# Patient Record
Sex: Female | Born: 1945 | Race: Black or African American | Hispanic: No | Smoking: Never smoker
Health system: Southern US, Community
[De-identification: ages and names within clinical notes are randomized; demographics above are authoritative.]

## PROBLEM LIST (undated history)

## (undated) DIAGNOSIS — R3129 Other microscopic hematuria: Secondary | ICD-10-CM

## (undated) HISTORY — PX: BUNIONECTOMY: SHX129

## (undated) HISTORY — DX: Other microscopic hematuria: R31.29

---

## 2000-01-20 ENCOUNTER — Other Ambulatory Visit: Admission: RE | Admit: 2000-01-20 | Discharge: 2000-01-20 | Payer: Self-pay | Admitting: Obstetrics and Gynecology

## 2001-01-31 ENCOUNTER — Other Ambulatory Visit: Admission: RE | Admit: 2001-01-31 | Discharge: 2001-01-31 | Payer: Self-pay | Admitting: Obstetrics and Gynecology

## 2002-01-31 ENCOUNTER — Other Ambulatory Visit: Admission: RE | Admit: 2002-01-31 | Discharge: 2002-01-31 | Payer: Self-pay | Admitting: Obstetrics and Gynecology

## 2003-02-26 ENCOUNTER — Other Ambulatory Visit: Admission: RE | Admit: 2003-02-26 | Discharge: 2003-02-26 | Payer: Self-pay | Admitting: Obstetrics and Gynecology

## 2004-03-23 ENCOUNTER — Other Ambulatory Visit: Admission: RE | Admit: 2004-03-23 | Discharge: 2004-03-23 | Payer: Self-pay | Admitting: Obstetrics and Gynecology

## 2005-04-06 ENCOUNTER — Other Ambulatory Visit: Admission: RE | Admit: 2005-04-06 | Discharge: 2005-04-06 | Payer: Self-pay | Admitting: Obstetrics and Gynecology

## 2006-04-28 ENCOUNTER — Other Ambulatory Visit: Admission: RE | Admit: 2006-04-28 | Discharge: 2006-04-28 | Payer: Self-pay | Admitting: Obstetrics and Gynecology

## 2007-08-02 ENCOUNTER — Other Ambulatory Visit: Admission: RE | Admit: 2007-08-02 | Discharge: 2007-08-02 | Payer: Self-pay | Admitting: Obstetrics and Gynecology

## 2007-10-26 ENCOUNTER — Emergency Department (HOSPITAL_COMMUNITY): Admission: EM | Admit: 2007-10-26 | Discharge: 2007-10-26 | Payer: Self-pay | Admitting: Family Medicine

## 2008-08-05 ENCOUNTER — Other Ambulatory Visit: Admission: RE | Admit: 2008-08-05 | Discharge: 2008-08-05 | Payer: Self-pay | Admitting: Obstetrics and Gynecology

## 2008-08-05 ENCOUNTER — Ambulatory Visit: Payer: Self-pay | Admitting: Obstetrics and Gynecology

## 2008-09-04 ENCOUNTER — Ambulatory Visit: Payer: Self-pay | Admitting: Obstetrics and Gynecology

## 2008-11-12 ENCOUNTER — Telehealth: Payer: Self-pay | Admitting: Internal Medicine

## 2009-08-06 ENCOUNTER — Ambulatory Visit: Payer: Self-pay | Admitting: Obstetrics and Gynecology

## 2009-08-06 ENCOUNTER — Other Ambulatory Visit: Admission: RE | Admit: 2009-08-06 | Discharge: 2009-08-06 | Payer: Self-pay | Admitting: Obstetrics and Gynecology

## 2010-08-23 ENCOUNTER — Ambulatory Visit: Admit: 2010-08-23 | Payer: Self-pay | Admitting: Obstetrics and Gynecology

## 2010-09-09 ENCOUNTER — Ambulatory Visit
Admission: RE | Admit: 2010-09-09 | Discharge: 2010-09-09 | Payer: Self-pay | Source: Home / Self Care | Attending: Obstetrics and Gynecology | Admitting: Obstetrics and Gynecology

## 2010-09-09 ENCOUNTER — Other Ambulatory Visit: Payer: Self-pay | Admitting: Obstetrics and Gynecology

## 2010-09-09 ENCOUNTER — Other Ambulatory Visit (HOSPITAL_COMMUNITY)
Admission: RE | Admit: 2010-09-09 | Discharge: 2010-09-09 | Disposition: A | Discharge status: Home / Self Care | Payer: 59 | Source: Ambulatory Visit | Attending: Obstetrics and Gynecology | Admitting: Obstetrics and Gynecology

## 2010-09-09 DIAGNOSIS — Z124 Encounter for screening for malignant neoplasm of cervix: Secondary | ICD-10-CM | POA: Insufficient documentation

## 2010-10-03 ENCOUNTER — Emergency Department (HOSPITAL_COMMUNITY)
Admission: EM | Admit: 2010-10-03 | Discharge: 2010-10-04 | Disposition: A | Discharge status: Home / Self Care | Payer: 59 | Attending: Emergency Medicine | Admitting: Emergency Medicine

## 2010-10-03 DIAGNOSIS — R05 Cough: Secondary | ICD-10-CM | POA: Insufficient documentation

## 2010-10-03 DIAGNOSIS — R059 Cough, unspecified: Secondary | ICD-10-CM | POA: Insufficient documentation

## 2010-10-03 DIAGNOSIS — R509 Fever, unspecified: Secondary | ICD-10-CM | POA: Insufficient documentation

## 2010-10-03 DIAGNOSIS — J40 Bronchitis, not specified as acute or chronic: Secondary | ICD-10-CM | POA: Insufficient documentation

## 2010-10-03 DIAGNOSIS — R51 Headache: Secondary | ICD-10-CM | POA: Insufficient documentation

## 2010-10-04 ENCOUNTER — Emergency Department (HOSPITAL_COMMUNITY): Payer: 59

## 2011-09-07 DIAGNOSIS — R3129 Other microscopic hematuria: Secondary | ICD-10-CM | POA: Insufficient documentation

## 2011-09-30 ENCOUNTER — Encounter: Payer: Self-pay | Admitting: Obstetrics and Gynecology

## 2011-09-30 ENCOUNTER — Other Ambulatory Visit (HOSPITAL_COMMUNITY)
Admission: RE | Admit: 2011-09-30 | Discharge: 2011-09-30 | Disposition: A | Discharge status: Home / Self Care | Payer: 59 | Source: Ambulatory Visit | Attending: Obstetrics and Gynecology | Admitting: Obstetrics and Gynecology

## 2011-09-30 ENCOUNTER — Ambulatory Visit (INDEPENDENT_AMBULATORY_CARE_PROVIDER_SITE_OTHER): Payer: 59 | Admitting: Obstetrics and Gynecology

## 2011-09-30 ENCOUNTER — Other Ambulatory Visit: Payer: Self-pay | Admitting: Obstetrics and Gynecology

## 2011-09-30 VITALS — BP 120/74 | Ht 63.0 in | Wt 162.0 lb

## 2011-09-30 DIAGNOSIS — Z01419 Encounter for gynecological examination (general) (routine) without abnormal findings: Secondary | ICD-10-CM | POA: Insufficient documentation

## 2011-09-30 LAB — LIPID PANEL
Cholesterol: 162 mg/dL (ref 0–200)
Total CHOL/HDL Ratio: 2.5 Ratio

## 2011-09-30 LAB — CBC WITH DIFFERENTIAL/PLATELET
Eosinophils Absolute: 0 10*3/uL (ref 0.0–0.7)
Eosinophils Relative: 1 % (ref 0–5)
HCT: 36.8 % (ref 36.0–46.0)
Hemoglobin: 12 g/dL (ref 12.0–15.0)
Lymphs Abs: 2.1 10*3/uL (ref 0.7–4.0)
MCH: 30.6 pg (ref 26.0–34.0)
MCV: 93.9 fL (ref 78.0–100.0)
Monocytes Absolute: 0.1 10*3/uL (ref 0.1–1.0)
Monocytes Relative: 2 % — ABNORMAL LOW (ref 3–12)
RBC: 3.92 MIL/uL (ref 3.87–5.11)

## 2011-09-30 LAB — URINALYSIS W MICROSCOPIC + REFLEX CULTURE
Crystals: NONE SEEN
Glucose, UA: NEGATIVE mg/dL
Protein, ur: NEGATIVE mg/dL
Specific Gravity, Urine: 1.025 (ref 1.005–1.030)
Urobilinogen, UA: 0.2 mg/dL (ref 0.0–1.0)

## 2011-09-30 NOTE — Progress Notes (Signed)
Patient came to see me today for her annual GYN exam. She is having no menopausal symptoms. She is having no vaginal bleeding. She is having no pelvic pain. She had a normal bone density. She is overdue for mammogram.  Physical examination: Kennon Portela present. HEENT within normal limits. Neck: Thyroid not large. No masses. Supraclavicular nodes: not enlarged. Breasts: Examined in both sitting midline position. No skin changes and no masses. Abdomen: Soft no guarding rebound or masses or hernia. Pelvic: External: Within normal limits. BUS: Within normal limits. Vaginal:within normal limits. Good estrogen effect. No evidence of cystocele rectocele or enterocele. Cervix: clean. Uterus: Normal size and shape. Adnexa: No masses. Rectovaginal exam: Confirmatory and negative. Extremities: Within normal limits.  Assessment: Normal GYN exam  Plan: Mammogram

## 2011-10-02 LAB — URINE CULTURE: Colony Count: 8000

## 2012-11-13 ENCOUNTER — Ambulatory Visit (INDEPENDENT_AMBULATORY_CARE_PROVIDER_SITE_OTHER): Payer: 59 | Admitting: Gynecology

## 2012-11-13 ENCOUNTER — Telehealth: Payer: Self-pay | Admitting: *Deleted

## 2012-11-13 ENCOUNTER — Encounter: Payer: Self-pay | Admitting: Gynecology

## 2012-11-13 ENCOUNTER — Telehealth: Payer: Self-pay | Admitting: Internal Medicine

## 2012-11-13 VITALS — BP 120/76 | Ht 64.0 in | Wt 156.0 lb

## 2012-11-13 DIAGNOSIS — N6310 Unspecified lump in the right breast, unspecified quadrant: Secondary | ICD-10-CM

## 2012-11-13 DIAGNOSIS — Z01419 Encounter for gynecological examination (general) (routine) without abnormal findings: Secondary | ICD-10-CM

## 2012-11-13 DIAGNOSIS — N63 Unspecified lump in unspecified breast: Secondary | ICD-10-CM

## 2012-11-13 DIAGNOSIS — M899 Disorder of bone, unspecified: Secondary | ICD-10-CM

## 2012-11-13 DIAGNOSIS — M8580 Other specified disorders of bone density and structure, unspecified site: Secondary | ICD-10-CM

## 2012-11-13 DIAGNOSIS — N952 Postmenopausal atrophic vaginitis: Secondary | ICD-10-CM

## 2012-11-13 DIAGNOSIS — Z13220 Encounter for screening for lipoid disorders: Secondary | ICD-10-CM

## 2012-11-13 LAB — COMPREHENSIVE METABOLIC PANEL
Albumin: 3.9 g/dL (ref 3.5–5.2)
Alkaline Phosphatase: 66 U/L (ref 39–117)
BUN: 12 mg/dL (ref 6–23)
Calcium: 9.6 mg/dL (ref 8.4–10.5)
Chloride: 107 mEq/L (ref 96–112)
Glucose, Bld: 83 mg/dL (ref 70–99)
Potassium: 4.1 mEq/L (ref 3.5–5.3)
Sodium: 140 mEq/L (ref 135–145)
Total Protein: 6.6 g/dL (ref 6.0–8.3)

## 2012-11-13 LAB — CBC WITH DIFFERENTIAL/PLATELET
HCT: 38.3 % (ref 36.0–46.0)
Hemoglobin: 13.1 g/dL (ref 12.0–15.0)
Lymphocytes Relative: 57 % — ABNORMAL HIGH (ref 12–46)
Lymphs Abs: 1.9 10*3/uL (ref 0.7–4.0)
MCHC: 34.2 g/dL (ref 30.0–36.0)
Monocytes Absolute: 0.2 10*3/uL (ref 0.1–1.0)
Monocytes Relative: 7 % (ref 3–12)
Neutro Abs: 1.2 10*3/uL — ABNORMAL LOW (ref 1.7–7.7)
Neutrophils Relative %: 33 % — ABNORMAL LOW (ref 43–77)
RBC: 4.42 MIL/uL (ref 3.87–5.11)
WBC: 3.4 10*3/uL — ABNORMAL LOW (ref 4.0–10.5)

## 2012-11-13 LAB — LIPID PANEL
HDL: 67 mg/dL (ref >39)
LDL Cholesterol: 100 mg/dL — ABNORMAL HIGH (ref 0–99)
Triglycerides: 72 mg/dL (ref <150)

## 2012-11-13 LAB — TSH: TSH: 1.415 u[IU]/mL (ref 0.350–4.500)

## 2012-11-13 NOTE — Telephone Encounter (Signed)
Order placed

## 2012-11-13 NOTE — Telephone Encounter (Signed)
Ok new pt Thx 

## 2012-11-13 NOTE — Progress Notes (Signed)
Sarah Sparks [birth year] [id number]        67 y.o.  G2P2001 for annual exam.  Former patient of Dr. Eda Paschal without complaints.  Past medical history,surgical history, medications, allergies, family history and social history were all reviewed and documented in the EPIC chart. ROS:  Was performed and pertinent positives and negatives are included in the history.  Exam: Kim assistant Filed Vitals:   11/13/12 1058  BP: 120/76  Height: 5\' 4"  (1.626 m)  Weight: 156 lb (70.761 kg)   General appearance  Normal Skin grossly normal Head/Neck normal with no cervical or supraclavicular adenopathy thyroid normal Lungs  clear Cardiac RR, without RMG Abdominal  soft, nontender, without masses, organomegaly or hernia Breasts  examined lying and sitting. Left without masses, retractions, discharge or axillary adenopathy. Right with palpable ill-defined mass approximately 1.5 cm tail of Spence. No overlying skin changes nipple discharge or axillary adenopathy. Pelvic  Ext/BUS/vagina  normal with mild atrophic changes  Cervix  normal with atrophic changes  Uterus  axial, normal size, shape and contour, midline and mobile nontender   Adnexa  Without masses or tenderness    Anus and perineum  normal   Rectovaginal  normal sphincter tone without palpated masses or tenderness.    Assessment/Plan:  67 y.o. G76P2001 female for annual exam.   1. Right breast mass, suspicious. Last mammogram a number of years ago. Reviewed findings with the patient and the need for followup to include diagnostic mammography and ultrasound. Possibilities for breast cancer reviewed. Patient understands the importance of followup and we will contact her to schedule these appointments. She knows if we do not call her within several days to call us in followup. 2. Postmenopausal. No bleeding or significant symptoms such as hot flushes, night sweats or vaginal dryness. We'll continue to follow. 3. Osteopenia. DEXA 2009 showed T  score -1.5. Recommend repeat DEXA now. Increase calcium vitamin D reviewed. 4. Colonoscopy never. I emphasized the risk of colon cancer with advancing age and the benefits of polyp or early cancer detection. I strongly recommended her to schedule a screening colonoscopy with [facility] as she works right across the street. Patient agrees to call and arrange. 5. Pap smear 2013. No Pap smear done today. No history of abnormal Pap smears. Discussed current screening guidelines and options to stop screening as she is 67 or less frequent screening options reviewed. We'll readdress on an annual basis. 6. Health maintenance. Patient does not have a primary physician. I recommended that she schedule an appointment with [facility] to establish care and be followed by them. She is 67 and I think that she needs to start being followed by an internist. I did order baseline labs to include CBC comprehensive metabolic panel TSH lipid profile vitamin D and urinalysis.   Dara Lords MD, 12:07 PM 11/13/2012

## 2012-11-13 NOTE — Telephone Encounter (Signed)
Pt called asking to see if Dr. Posey Rea will take her as a new pt, I inform pt that the doctor is not taking new pt at this time. Pt stated that her parents used to come to Dr. Wonda Horner Allyson Sabal and Marguerita Merles), that's why she wants to see Dr. Macario Golds as a PCP. Please advise.

## 2012-11-13 NOTE — Telephone Encounter (Signed)
Message copied by Aura Camps on Tue Nov 13, 2012  2:40 PM ------      Message from: Dara Lords      Created: Tue Nov 13, 2012 12:13 PM       Schedule diagnostic mammography and ultrasound at breast center. Reference right tail of Spence breast mass suspicious for cancer. ------

## 2012-11-13 NOTE — Patient Instructions (Signed)
Office will call you to arrange mammography and ultrasound to assess the right breast mass. It is very important that we rule out breast cancer and that you followup for these exams. Call [facility] and arrange for a screening colonoscopy. Early detection of colon cancer is very important and could save your life. Followup in one year for annual exam.

## 2012-11-14 ENCOUNTER — Encounter: Payer: Self-pay | Admitting: Obstetrics and Gynecology

## 2012-11-14 ENCOUNTER — Other Ambulatory Visit: Payer: Self-pay | Admitting: *Deleted

## 2012-11-14 DIAGNOSIS — N6310 Unspecified lump in the right breast, unspecified quadrant: Secondary | ICD-10-CM

## 2012-11-14 LAB — URINALYSIS W MICROSCOPIC + REFLEX CULTURE
Bilirubin Urine: NEGATIVE
Casts: NONE SEEN
Crystals: NONE SEEN
Ketones, ur: NEGATIVE mg/dL
Nitrite: NEGATIVE
Specific Gravity, Urine: 1.014 (ref 1.005–1.030)
Urobilinogen, UA: 0.2 mg/dL (ref 0.0–1.0)
pH: 6 (ref 5.0–8.0)

## 2012-11-14 NOTE — Telephone Encounter (Signed)
Called pt, left VM to call back. Will try again if pt have not call back.

## 2012-11-16 ENCOUNTER — Other Ambulatory Visit: Payer: Self-pay | Admitting: Gynecology

## 2012-11-16 DIAGNOSIS — E559 Vitamin D deficiency, unspecified: Secondary | ICD-10-CM

## 2012-11-16 DIAGNOSIS — R8271 Bacteriuria: Secondary | ICD-10-CM

## 2012-11-16 LAB — URINE CULTURE: Colony Count: 45000

## 2012-11-16 MED ORDER — ERGOCALCIFEROL 1.25 MG (50000 UT) PO CAPS: 50000.0000 [IU] | ORAL_CAPSULE | ORAL | Status: DC

## 2012-11-22 ENCOUNTER — Other Ambulatory Visit: Payer: 59

## 2012-11-27 ENCOUNTER — Encounter: Payer: Self-pay | Admitting: Gynecology

## 2012-11-27 NOTE — Telephone Encounter (Signed)
I tried to call pt to ask her to call breast center back to schedule testing, they have left 2 messages for pt to call and schedule. I called home # and it is no longer working, I called work # and pt was off today. Will try back later.

## 2012-12-17 NOTE — Telephone Encounter (Signed)
I called patient regarding the below staff message TF sent to me. I left message on home # for pt to call.   " I was following up on this patient and she was to have a diagnostic mammogram and ultrasound in reference to mass in the right breast. It looks like she had a screening mammogram which is not the right test. Recommend office visit so I can reexamine her and then decide if we need to pursue a diagnostic look at that site. Let me know that the patient has scheduled the appointment."

## 2012-12-20 ENCOUNTER — Other Ambulatory Visit: Payer: 59

## 2012-12-20 ENCOUNTER — Ambulatory Visit: Payer: 59

## 2012-12-20 DIAGNOSIS — R8271 Bacteriuria: Secondary | ICD-10-CM

## 2012-12-21 ENCOUNTER — Other Ambulatory Visit: Payer: 59

## 2012-12-21 ENCOUNTER — Ambulatory Visit (INDEPENDENT_AMBULATORY_CARE_PROVIDER_SITE_OTHER): Payer: 59 | Admitting: Internal Medicine

## 2012-12-21 ENCOUNTER — Encounter: Payer: Self-pay | Admitting: Internal Medicine

## 2012-12-21 VITALS — BP 118/80 | HR 84 | Temp 98.5°F | Resp 16 | Ht 63.0 in | Wt 157.0 lb

## 2012-12-21 DIAGNOSIS — Z0000 Encounter for general adult medical examination without abnormal findings: Secondary | ICD-10-CM | POA: Insufficient documentation

## 2012-12-21 DIAGNOSIS — E559 Vitamin D deficiency, unspecified: Secondary | ICD-10-CM

## 2012-12-21 DIAGNOSIS — Z23 Encounter for immunization: Secondary | ICD-10-CM

## 2012-12-21 LAB — URINE CULTURE
Colony Count: NO GROWTH
Organism ID, Bacteria: NO GROWTH

## 2012-12-21 MED ORDER — ONE-DAILY MULTI VITAMINS PO TABS: 1.0000 | ORAL_TABLET | Freq: Every day | ORAL | Status: DC

## 2012-12-21 NOTE — Assessment & Plan Note (Signed)
DT Pneumovax Colonosc suggested

## 2012-12-21 NOTE — Progress Notes (Signed)
  Subjective:    Patient ID: Sarah Sparks, female    DOB: 1946/01/20, 67 y.o.   MRN: [id number]  HPI  The patient is here for a wellness exam. The patient has been doing well overall without major physical or psychological issues going on lately.  BP Readings from Last 3 Encounters:  12/21/12 118/80  11/13/12 120/76  09/30/11 120/74   Wt Readings from Last 3 Encounters:  12/21/12 157 lb (71.215 kg)  11/13/12 156 lb (70.761 kg)  09/30/11 162 lb (73.483 kg)     Review of Systems  Constitutional: Negative for fever, chills, diaphoresis, activity change, appetite change, fatigue and unexpected weight change.  HENT: Negative for hearing loss, ear pain, congestion, sore throat, sneezing, mouth sores, neck pain, dental problem, voice change, postnasal drip and sinus pressure.   Eyes: Negative for pain and visual disturbance.  Respiratory: Negative for cough, chest tightness, wheezing and stridor.   Cardiovascular: Negative for chest pain, palpitations and leg swelling.  Gastrointestinal: Negative for nausea, vomiting, abdominal pain, blood in stool, abdominal distention and rectal pain.  Genitourinary: Negative for dysuria, hematuria, decreased urine volume, vaginal bleeding, vaginal discharge, difficulty urinating, vaginal pain and menstrual problem.  Musculoskeletal: Negative for back pain, joint swelling and gait problem.  Skin: Negative for color change, rash and wound.  Neurological: Negative for dizziness, tremors, syncope, speech difficulty and light-headedness.  Hematological: Negative for adenopathy.  Psychiatric/Behavioral: Negative for suicidal ideas, hallucinations, behavioral problems, confusion, sleep disturbance, dysphoric mood and decreased concentration. The patient is not hyperactive.        Objective:   Physical Exam  Constitutional: She appears well-developed. No distress.  HENT:  Head: Normocephalic.  Right Ear: External ear normal.  Left Ear: External ear  normal.  Nose: Nose normal.  Mouth/Throat: Oropharynx is clear and moist.  Eyes: Conjunctivae are normal. Pupils are equal, round, and reactive to light. Right eye exhibits no discharge. Left eye exhibits no discharge.  Neck: Normal range of motion. Neck supple. No JVD present. No tracheal deviation present. No thyromegaly present.  Cardiovascular: Normal rate, regular rhythm and normal heart sounds.   Pulmonary/Chest: No stridor. No respiratory distress. She has no wheezes.  Abdominal: Soft. Bowel sounds are normal. She exhibits no distension and no mass. There is no tenderness. There is no rebound and no guarding.  Musculoskeletal: She exhibits no edema and no tenderness.  Lymphadenopathy:    She has no cervical adenopathy.  Neurological: She displays normal reflexes. No cranial nerve deficit. She exhibits normal muscle tone. Coordination normal.  Skin: No rash noted. No erythema.  Psychiatric: She has a normal mood and affect. Her behavior is normal. Judgment and thought content normal.    Lab Results  Component Value Date   WBC 3.4* 11/13/2012   HGB 13.1 11/13/2012   HCT 38.3 11/13/2012   PLT 216 11/13/2012   GLUCOSE 83 11/13/2012   CHOL 181 11/13/2012   TRIG 72 11/13/2012   HDL 67 11/13/2012   LDLCALC 147* 11/13/2012   ALT 14 11/13/2012   AST 16 11/13/2012   NA 140 11/13/2012   K 4.1 11/13/2012   CL 107 11/13/2012   CREATININE 0.85 11/13/2012   BUN 12 11/13/2012   CO2 25 11/13/2012   TSH 1.415 11/13/2012         Assessment & Plan:

## 2012-12-21 NOTE — Assessment & Plan Note (Signed)
Continue with current prescription therapy as reflected on the Med list.  

## 2012-12-22 LAB — VITAMIN D 25 HYDROXY (VIT D DEFICIENCY, FRACTURES): Vit D, 25-Hydroxy: 25 ng/mL — ABNORMAL LOW (ref 30–89)

## 2012-12-23 ENCOUNTER — Other Ambulatory Visit: Payer: Self-pay | Admitting: Internal Medicine

## 2012-12-23 MED ORDER — ERGOCALCIFEROL 1.25 MG (50000 UT) PO CAPS: 50000.0000 [IU] | ORAL_CAPSULE | ORAL | Status: DC

## 2012-12-25 ENCOUNTER — Encounter: Payer: Self-pay | Admitting: Gynecology

## 2012-12-25 ENCOUNTER — Ambulatory Visit (INDEPENDENT_AMBULATORY_CARE_PROVIDER_SITE_OTHER): Payer: 59 | Admitting: Gynecology

## 2012-12-25 DIAGNOSIS — N63 Unspecified lump in unspecified breast: Secondary | ICD-10-CM

## 2012-12-25 NOTE — Telephone Encounter (Signed)
Pt will come in today at 4:20 for reexamine of the breast.

## 2012-12-25 NOTE — Patient Instructions (Signed)
Office will contact you to arrange ultrasound and diagnostic mammography.

## 2012-12-25 NOTE — Progress Notes (Signed)
Patient ID: Sarah Sparks, female   DOB: 1945/08/31, 67 y.o.   MRN: [id number] Patient presents in followup for her exam. I recently saw her for an annual exam 11/13/2012. At that time I felt a right breast mass and had recommended diagnostic mammography. The patient states that following my exam the area had disappeared and she no longer felt it and just went in for a screening mammogram which was normal. I asked her to come back and let me reexamine her today.  Exam with Tim assistant Both breasts examined lying and sitting Left without masses retractions discharge adenopathy. Right with ill-defined glandular density right tail of Spence. No overlying skin changes nipple discharge or axillary adenopathy.  Assessment and plan: Right tail of Spence breast mass. Probable breast tissue benign glandular change but I would feel better with ultrasound possible diagnostic mammogram of this area. We'll arrange to have done at [facility] where her screening mammogram was done. Assuming these are normal then patient will continue with self exam and as long she feels no changes in this area she'll follow. Obviously if anything concerning them we'll pursue a more aggressive evaluation.

## 2012-12-26 ENCOUNTER — Telehealth: Payer: Self-pay | Admitting: *Deleted

## 2012-12-26 NOTE — Telephone Encounter (Signed)
Pt informed with the below note. 

## 2012-12-26 NOTE — Telephone Encounter (Signed)
Order signed and faxed to [facility] for breast imaging. appt on 12/27/12 @ 8:00 am. Left message for pt to call me at her work #

## 2012-12-26 NOTE — Telephone Encounter (Signed)
Message copied by Aura Camps on Wed Dec 26, 2012 11:21 AM ------      Message from: Dara Lords      Created: Tue Dec 25, 2012  4:35 PM       Patient needs ultrasound and possible diagnostic mammogram at [facility] reference right tail of Spence mass. Had recent screening mammography which was normal but has persistent right glandular feeling mass in the tail of Spence. May be just normal breast tissue but need to clear this area with ultrasound and possible diagnostic mammogram at radiologist discretion ------

## 2013-01-17 ENCOUNTER — Telehealth: Payer: Self-pay | Admitting: *Deleted

## 2013-01-17 DIAGNOSIS — Z0000 Encounter for general adult medical examination without abnormal findings: Secondary | ICD-10-CM

## 2013-01-17 NOTE — Telephone Encounter (Signed)
Message copied by Merrilyn Puma on Thu Jan 17, 2013  4:27 PM ------      Message from: Livingston Diones      Created: Fri Dec 21, 2012 11:00 AM       Pt has a CPE appt 12/23/13. Please put lab work in a week prior.  ------

## 2013-01-17 NOTE — Telephone Encounter (Signed)
Labs entered.

## 2013-01-21 ENCOUNTER — Telehealth: Payer: Self-pay | Admitting: *Deleted

## 2013-01-21 NOTE — Telephone Encounter (Signed)
Message copied by Aura Camps on Mon Jan 21, 2013 10:41 AM ------      Message from: Dara Lords      Created: Tue Jan 15, 2013 10:37 AM                   ----- Message -----         From: Ted Mcalpine         Sent: 01/15/2013  10:28 AM           To: Dara Lords, MD            Pt was scheduled for 12/27/12 and she was informed by me about this appointment but solis said she canceled and never called back to reschedule. I called her and left her a message to please call me so we can get the mammogram scheduled again or to call and schedule herself and let me know time and date.      ----- Message -----         From: Dara Lords, MD         Sent: 01/11/2013   4:39 PM           To: Ted Mcalpine            My 12/25/2012 note. Has the followup ultrasound/mammogram been arranged?        ------

## 2013-01-21 NOTE — Telephone Encounter (Signed)
I called pt again today at her to ask her if she schedule her below test, pt said she will call today and schedule for tomorrow since she is off work. I explained to pt this very important to have this test done. She promised me that she will schedule I told her that I will call again to make sure this is done.

## 2013-01-28 ENCOUNTER — Encounter: Payer: Self-pay | Admitting: Gynecology

## 2013-01-29 NOTE — Telephone Encounter (Signed)
Pt has testing done on 01/28/13 at [facility] imaging.

## 2013-12-23 ENCOUNTER — Other Ambulatory Visit (INDEPENDENT_AMBULATORY_CARE_PROVIDER_SITE_OTHER): Payer: 59

## 2013-12-23 ENCOUNTER — Encounter: Payer: Self-pay | Admitting: Internal Medicine

## 2013-12-23 ENCOUNTER — Ambulatory Visit (INDEPENDENT_AMBULATORY_CARE_PROVIDER_SITE_OTHER): Payer: 59 | Admitting: Internal Medicine

## 2013-12-23 VITALS — BP 116/72 | HR 72 | Temp 98.2°F | Resp 16 | Ht 64.0 in | Wt 155.0 lb

## 2013-12-23 DIAGNOSIS — Z0000 Encounter for general adult medical examination without abnormal findings: Secondary | ICD-10-CM

## 2013-12-23 DIAGNOSIS — E559 Vitamin D deficiency, unspecified: Secondary | ICD-10-CM

## 2013-12-23 DIAGNOSIS — Z23 Encounter for immunization: Secondary | ICD-10-CM

## 2013-12-23 LAB — URINALYSIS, ROUTINE W REFLEX MICROSCOPIC
BILIRUBIN URINE: NEGATIVE
KETONES UR: NEGATIVE
LEUKOCYTES UA: NEGATIVE
Nitrite: NEGATIVE
PH: 6 (ref 5.0–8.0)
Specific Gravity, Urine: 1.025 (ref 1.000–1.030)
Total Protein, Urine: NEGATIVE
Urine Glucose: NEGATIVE
Urobilinogen, UA: 0.2 (ref 0.0–1.0)

## 2013-12-23 LAB — CBC WITH DIFFERENTIAL/PLATELET
BASOS PCT: 0.9 % (ref 0.0–3.0)
Basophils Absolute: 0 10*3/uL (ref 0.0–0.1)
EOS PCT: 3.2 % (ref 0.0–5.0)
Eosinophils Absolute: 0.1 10*3/uL (ref 0.0–0.7)
HCT: 36.6 % (ref 36.0–46.0)
HEMOGLOBIN: 12.4 g/dL (ref 12.0–15.0)
LYMPHS PCT: 49.9 % — AB (ref 12.0–46.0)
Lymphs Abs: 1.9 10*3/uL (ref 0.7–4.0)
MCHC: 33.9 g/dL (ref 30.0–36.0)
MCV: 91.6 fl (ref 78.0–100.0)
MONOS PCT: 10.2 % (ref 3.0–12.0)
Monocytes Absolute: 0.4 10*3/uL (ref 0.1–1.0)
Neutro Abs: 1.4 10*3/uL (ref 1.4–7.7)
Neutrophils Relative %: 35.8 % — ABNORMAL LOW (ref 43.0–77.0)
Platelets: 245 10*3/uL (ref 150.0–400.0)
RBC: 3.99 Mil/uL (ref 3.87–5.11)
RDW: 12.6 % (ref 11.5–15.5)
WBC: 3.9 10*3/uL — AB (ref 4.0–10.5)

## 2013-12-23 LAB — BASIC METABOLIC PANEL
BUN: 16 mg/dL (ref 6–23)
CALCIUM: 9.6 mg/dL (ref 8.4–10.5)
CO2: 26 mEq/L (ref 19–32)
Chloride: 106 mEq/L (ref 96–112)
Creatinine, Ser: 0.7 mg/dL (ref 0.4–1.2)
GFR: 105.82 mL/min (ref >60.00)
GLUCOSE: 97 mg/dL (ref 70–99)
POTASSIUM: 3.8 meq/L (ref 3.5–5.1)
Sodium: 139 mEq/L (ref 135–145)

## 2013-12-23 LAB — LIPID PANEL
CHOL/HDL RATIO: 3
Cholesterol: 184 mg/dL (ref 0–200)
HDL: 68.2 mg/dL (ref >39.00)
LDL Cholesterol: 104 mg/dL — ABNORMAL HIGH (ref 0–99)
Triglycerides: 57 mg/dL (ref 0.0–149.0)
VLDL: 11.4 mg/dL (ref 0.0–40.0)

## 2013-12-23 LAB — HEPATIC FUNCTION PANEL
ALK PHOS: 54 U/L (ref 39–117)
ALT: 19 U/L (ref 0–35)
AST: 19 U/L (ref 0–37)
Albumin: 3.7 g/dL (ref 3.5–5.2)
Bilirubin, Direct: 0.1 mg/dL (ref 0.0–0.3)
TOTAL PROTEIN: 6.9 g/dL (ref 6.0–8.3)
Total Bilirubin: 0.8 mg/dL (ref 0.2–1.2)

## 2013-12-23 LAB — TSH: TSH: 1.52 u[IU]/mL (ref 0.35–4.50)

## 2013-12-23 NOTE — Progress Notes (Signed)
Pre visit review using our clinic review tool, if applicable. No additional management support is needed unless otherwise documented below in the visit note. 

## 2013-12-23 NOTE — Assessment & Plan Note (Addendum)
We discussed age appropriate health related issues, including available/recomended screening tests and vaccinations. We discussed a need for adhering to healthy diet and exercise. Labs/EKG were reviewed/ordered. All questions were answered.  PAP per GYN Dr Audie BoxFontaine Colonoscopy suggested. She will do Cologuard Zostavax - she will think about it

## 2013-12-23 NOTE — Progress Notes (Signed)
   Subjective:    HPI  The patient is here for a wellness exam. The patient has been doing well overall without major physical or psychological issues going on lately.  BP Readings from Last 3 Encounters:  12/23/13 116/72  12/21/12 118/80  11/13/12 120/76   Wt Readings from Last 3 Encounters:  12/23/13 155 lb (70.308 kg)  12/21/12 157 lb (71.215 kg)  11/13/12 156 lb (70.761 kg)     Review of Systems  Constitutional: Negative for fever, chills, diaphoresis, activity change, appetite change, fatigue and unexpected weight change.  HENT: Negative for congestion, dental problem, ear pain, hearing loss, mouth sores, postnasal drip, sinus pressure, sneezing, sore throat and voice change.   Eyes: Negative for pain and visual disturbance.  Respiratory: Negative for cough, chest tightness, wheezing and stridor.   Cardiovascular: Negative for chest pain, palpitations and leg swelling.  Gastrointestinal: Negative for nausea, vomiting, abdominal pain, blood in stool, abdominal distention and rectal pain.  Genitourinary: Negative for dysuria, hematuria, decreased urine volume, vaginal bleeding, vaginal discharge, difficulty urinating, vaginal pain and menstrual problem.  Musculoskeletal: Negative for back pain, gait problem, joint swelling and neck pain.  Skin: Negative for color change, rash and wound.  Neurological: Negative for dizziness, tremors, syncope, speech difficulty and light-headedness.  Hematological: Negative for adenopathy.  Psychiatric/Behavioral: Negative for suicidal ideas, hallucinations, behavioral problems, confusion, sleep disturbance, dysphoric mood and decreased concentration. The patient is not hyperactive.        Objective:   Physical Exam  Constitutional: She appears well-developed. No distress.  HENT:  Head: Normocephalic.  Right Ear: External ear normal.  Left Ear: External ear normal.  Nose: Nose normal.  Mouth/Throat: Oropharynx is clear and moist.  Eyes:  Conjunctivae are normal. Pupils are equal, round, and reactive to light. Right eye exhibits no discharge. Left eye exhibits no discharge.  Neck: Normal range of motion. Neck supple. No JVD present. No tracheal deviation present. No thyromegaly present.  Cardiovascular: Normal rate, regular rhythm and normal heart sounds.   Pulmonary/Chest: No stridor. No respiratory distress. She has no wheezes.  Abdominal: Soft. Bowel sounds are normal. She exhibits no distension and no mass. There is no tenderness. There is no rebound and no guarding.  Musculoskeletal: She exhibits no edema and no tenderness.  Lymphadenopathy:    She has no cervical adenopathy.  Neurological: She displays normal reflexes. No cranial nerve deficit. She exhibits normal muscle tone. Coordination normal.  Skin: No rash noted. No erythema.  Psychiatric: She has a normal mood and affect. Her behavior is normal. Judgment and thought content normal.    Lab Results  Component Value Date   WBC 3.4* 11/13/2012   HGB 13.1 11/13/2012   HCT 38.3 11/13/2012   PLT 216 11/13/2012   GLUCOSE 83 11/13/2012   CHOL 181 11/13/2012   TRIG 72 11/13/2012   HDL 67 11/13/2012   LDLCALC 952100* 11/13/2012   ALT 14 11/13/2012   AST 16 11/13/2012   NA 140 11/13/2012   K 4.1 11/13/2012   CL 107 11/13/2012   CREATININE 0.85 11/13/2012   BUN 12 11/13/2012   CO2 25 11/13/2012   TSH 1.415 11/13/2012    EKG     Assessment & Plan:

## 2013-12-24 ENCOUNTER — Other Ambulatory Visit: Payer: Self-pay | Admitting: Internal Medicine

## 2013-12-24 LAB — VITAMIN D 25 HYDROXY (VIT D DEFICIENCY, FRACTURES): Vit D, 25-Hydroxy: 23 ng/mL — ABNORMAL LOW (ref 30–89)

## 2013-12-24 MED ORDER — VITAMIN D 1000 UNITS PO TABS: 1000.0000 [IU] | ORAL_TABLET | Freq: Every day | ORAL | Status: AC

## 2013-12-24 MED ORDER — ERGOCALCIFEROL 1.25 MG (50000 UT) PO CAPS: 50000.0000 [IU] | ORAL_CAPSULE | ORAL | Status: DC

## 2014-01-09 LAB — COLOGUARD: Cologuard: NEGATIVE

## 2014-01-17 ENCOUNTER — Telehealth: Payer: Self-pay | Admitting: *Deleted

## 2014-01-17 NOTE — Telephone Encounter (Signed)
Pt's spouse Eddie informed Cologaurd Colon cancer screen is negative.

## 2014-06-16 ENCOUNTER — Encounter: Payer: Self-pay | Admitting: Internal Medicine

## 2014-12-26 ENCOUNTER — Encounter: Payer: 59 | Admitting: Internal Medicine

## 2015-01-15 ENCOUNTER — Encounter: Payer: Self-pay | Admitting: Internal Medicine

## 2015-01-15 ENCOUNTER — Ambulatory Visit (INDEPENDENT_AMBULATORY_CARE_PROVIDER_SITE_OTHER): Payer: 59 | Admitting: Internal Medicine

## 2015-01-15 ENCOUNTER — Other Ambulatory Visit (INDEPENDENT_AMBULATORY_CARE_PROVIDER_SITE_OTHER): Payer: 59

## 2015-01-15 VITALS — BP 118/68 | HR 62 | Ht 66.0 in | Wt 156.0 lb

## 2015-01-15 DIAGNOSIS — Z0000 Encounter for general adult medical examination without abnormal findings: Secondary | ICD-10-CM | POA: Diagnosis not present

## 2015-01-15 DIAGNOSIS — E559 Vitamin D deficiency, unspecified: Secondary | ICD-10-CM | POA: Diagnosis not present

## 2015-01-15 LAB — CBC WITH DIFFERENTIAL/PLATELET
BASOS ABS: 0 10*3/uL (ref 0.0–0.1)
BASOS PCT: 0.6 % (ref 0.0–3.0)
Eosinophils Absolute: 0.1 10*3/uL (ref 0.0–0.7)
Eosinophils Relative: 3.2 % (ref 0.0–5.0)
HCT: 36 % (ref 36.0–46.0)
Hemoglobin: 12.2 g/dL (ref 12.0–15.0)
LYMPHS ABS: 1.8 10*3/uL (ref 0.7–4.0)
Lymphocytes Relative: 44.2 % (ref 12.0–46.0)
MCHC: 33.9 g/dL (ref 30.0–36.0)
MCV: 92.1 fl (ref 78.0–100.0)
MONOS PCT: 8.5 % (ref 3.0–12.0)
Monocytes Absolute: 0.3 10*3/uL (ref 0.1–1.0)
NEUTROS ABS: 1.8 10*3/uL (ref 1.4–7.7)
NEUTROS PCT: 43.5 % (ref 43.0–77.0)
Platelets: 206 10*3/uL (ref 150.0–400.0)
RBC: 3.91 Mil/uL (ref 3.87–5.11)
RDW: 12.6 % (ref 11.5–15.5)
WBC: 4.1 10*3/uL (ref 4.0–10.5)

## 2015-01-15 LAB — TSH: TSH: 1.4 u[IU]/mL (ref 0.35–4.50)

## 2015-01-15 LAB — HEPATIC FUNCTION PANEL
ALBUMIN: 4 g/dL (ref 3.5–5.2)
ALK PHOS: 61 U/L (ref 39–117)
ALT: 14 U/L (ref 0–35)
AST: 16 U/L (ref 0–37)
Bilirubin, Direct: 0.2 mg/dL (ref 0.0–0.3)
Total Bilirubin: 0.8 mg/dL (ref 0.2–1.2)
Total Protein: 7 g/dL (ref 6.0–8.3)

## 2015-01-15 LAB — URINALYSIS, ROUTINE W REFLEX MICROSCOPIC
Bilirubin Urine: NEGATIVE
Ketones, ur: NEGATIVE
Nitrite: NEGATIVE
SPECIFIC GRAVITY, URINE: 1.025 (ref 1.000–1.030)
TOTAL PROTEIN, URINE-UPE24: NEGATIVE
URINE GLUCOSE: NEGATIVE
Urobilinogen, UA: 0.2 (ref 0.0–1.0)
pH: 5.5 (ref 5.0–8.0)

## 2015-01-15 LAB — BASIC METABOLIC PANEL
BUN: 15 mg/dL (ref 6–23)
CO2: 23 mEq/L (ref 19–32)
Calcium: 9.5 mg/dL (ref 8.4–10.5)
Chloride: 108 mEq/L (ref 96–112)
Creatinine, Ser: 0.87 mg/dL (ref 0.40–1.20)
GFR: 83.43 mL/min (ref >60.00)
GLUCOSE: 90 mg/dL (ref 70–99)
POTASSIUM: 4 meq/L (ref 3.5–5.1)
SODIUM: 137 meq/L (ref 135–145)

## 2015-01-15 LAB — LIPID PANEL
Cholesterol: 174 mg/dL (ref 0–200)
HDL: 69.9 mg/dL (ref >39.00)
LDL Cholesterol: 93 mg/dL (ref 0–99)
NONHDL: 104.1
Total CHOL/HDL Ratio: 2
Triglycerides: 56 mg/dL (ref 0.0–149.0)
VLDL: 11.2 mg/dL (ref 0.0–40.0)

## 2015-01-15 LAB — VITAMIN D 25 HYDROXY (VIT D DEFICIENCY, FRACTURES): VITD: 14.2 ng/mL — ABNORMAL LOW (ref 30.00–100.00)

## 2015-01-15 MED ORDER — LORATADINE 10 MG PO TABS: 10.0000 mg | ORAL_TABLET | Freq: Every day | ORAL | Status: DC

## 2015-01-15 MED ORDER — CHOLECALCIFEROL 25 MCG (1000 UT) PO TABS: 2000.0000 [IU] | ORAL_TABLET | Freq: Every day | ORAL | Status: DC

## 2015-01-15 MED ORDER — ERGOCALCIFEROL 1.25 MG (50000 UT) PO CAPS: 50000.0000 [IU] | ORAL_CAPSULE | ORAL | Status: DC

## 2015-01-15 NOTE — Assessment & Plan Note (Signed)
start Vit D prescription 50000 iu weekly (Rx emailed to your pharmacy) followed by over-the-counter Vit D 2000 iu daily.  

## 2015-01-15 NOTE — Progress Notes (Signed)
   Subjective:    HPI  The patient is here for a wellness exam. The patient has been doing well overall without major physical or psychological issues going on lately.  BP Readings from Last 3 Encounters:  01/15/15 118/68  12/23/13 116/72  12/21/12 118/80   Wt Readings from Last 3 Encounters:  01/15/15 156 lb (70.761 kg)  12/23/13 155 lb (70.308 kg)  12/21/12 157 lb (71.215 kg)     Review of Systems  Constitutional: Negative for fever, chills, diaphoresis, activity change, appetite change, fatigue and unexpected weight change.  HENT: Negative for congestion, dental problem, ear pain, hearing loss, mouth sores, postnasal drip, sinus pressure, sneezing, sore throat and voice change.   Eyes: Negative for pain and visual disturbance.  Respiratory: Negative for cough, chest tightness, wheezing and stridor.   Cardiovascular: Negative for chest pain, palpitations and leg swelling.  Gastrointestinal: Negative for nausea, vomiting, abdominal pain, blood in stool, abdominal distention and rectal pain.  Genitourinary: Negative for dysuria, hematuria, decreased urine volume, vaginal bleeding, vaginal discharge, difficulty urinating, vaginal pain and menstrual problem.  Musculoskeletal: Negative for back pain, joint swelling, gait problem and neck pain.  Skin: Negative for color change, rash and wound.  Neurological: Negative for dizziness, tremors, syncope, speech difficulty and light-headedness.  Hematological: Negative for adenopathy.  Psychiatric/Behavioral: Negative for suicidal ideas, hallucinations, behavioral problems, confusion, sleep disturbance, dysphoric mood and decreased concentration. The patient is not hyperactive.        Objective:   Physical Exam  Constitutional: She appears well-developed. No distress.  HENT:  Head: Normocephalic.  Right Ear: External ear normal.  Left Ear: External ear normal.  Nose: Nose normal.  Mouth/Throat: Oropharynx is clear and moist.  Eyes:  Conjunctivae are normal. Pupils are equal, round, and reactive to light. Right eye exhibits no discharge. Left eye exhibits no discharge.  Neck: Normal range of motion. Neck supple. No JVD present. No tracheal deviation present. No thyromegaly present.  Cardiovascular: Normal rate, regular rhythm and normal heart sounds.   Pulmonary/Chest: No stridor. No respiratory distress. She has no wheezes.  Abdominal: Soft. Bowel sounds are normal. She exhibits no distension and no mass. There is no tenderness. There is no rebound and no guarding.  Musculoskeletal: She exhibits no edema or tenderness.  Lymphadenopathy:    She has no cervical adenopathy.  Neurological: She displays normal reflexes. No cranial nerve deficit. She exhibits normal muscle tone. Coordination normal.  Skin: No rash noted. No erythema.  Psychiatric: She has a normal mood and affect. Her behavior is normal. Judgment and thought content normal.    Lab Results  Component Value Date   WBC 3.9* 12/23/2013   HGB 12.4 12/23/2013   HCT 36.6 12/23/2013   PLT 245.0 12/23/2013   GLUCOSE 97 12/23/2013   CHOL 184 12/23/2013   TRIG 57.0 12/23/2013   HDL 68.20 12/23/2013   LDLCALC 104* 12/23/2013   ALT 19 12/23/2013   AST 19 12/23/2013   NA 139 12/23/2013   K 3.8 12/23/2013   CL 106 12/23/2013   CREATININE 0.7 12/23/2013   BUN 16 12/23/2013   CO2 26 12/23/2013   TSH 1.52 12/23/2013    Cologuard (-) 2015     Assessment & Plan:

## 2015-01-15 NOTE — Progress Notes (Signed)
Pre visit review using our clinic review tool, if applicable. No additional management support is needed unless otherwise documented below in the visit note. 

## 2015-01-15 NOTE — Assessment & Plan Note (Signed)
We discussed age appropriate health related issues, including available/recomended screening tests and vaccinations. We discussed a need for adhering to healthy diet and exercise. Labs/EKG were reviewed/ordered. All questions were answered.  PAP per GYN Dr Audie BoxFontaine Cologuard (-) [phone]

## 2015-02-27 ENCOUNTER — Telehealth: Payer: Self-pay | Admitting: *Deleted

## 2015-02-27 NOTE — Telephone Encounter (Signed)
Patient brought letter from her ins [employer] regarding the denial of payment for her 01/02/2014 Cologaurd Colon cancer screening. I called [employer] [employer] and spoke to [place]. He states the pt's plan is still upholding the denial even after the initial appeal. He states that all of their appeal options have been exhausted. However, he states the patient can initiate her own appeal and he will contact her to advise of her options and help her start her appeal process. As of today, 02/27/15 the claim was sent to external appeal and [employer] has not sent a bill to pt as they are trying all appeal processes. Molly MaduroRobert states there is nothing further needed from us.  I left a detailed mess informing pt of this and to be expecting a call from Molly Maduroobert at [employer].

## 2016-01-19 ENCOUNTER — Other Ambulatory Visit (INDEPENDENT_AMBULATORY_CARE_PROVIDER_SITE_OTHER): Payer: 59

## 2016-01-19 ENCOUNTER — Ambulatory Visit (INDEPENDENT_AMBULATORY_CARE_PROVIDER_SITE_OTHER)
Admission: RE | Admit: 2016-01-19 | Discharge: 2016-01-19 | Disposition: A | Discharge status: Home / Self Care | Payer: 59 | Source: Ambulatory Visit | Attending: Internal Medicine | Admitting: Internal Medicine

## 2016-01-19 ENCOUNTER — Ambulatory Visit: Payer: 59 | Admitting: Internal Medicine

## 2016-01-19 ENCOUNTER — Encounter: Payer: Self-pay | Admitting: Internal Medicine

## 2016-01-19 VITALS — BP 130/78 | HR 58 | Ht 66.0 in | Wt 152.0 lb

## 2016-01-19 DIAGNOSIS — E559 Vitamin D deficiency, unspecified: Secondary | ICD-10-CM

## 2016-01-19 DIAGNOSIS — Z0000 Encounter for general adult medical examination without abnormal findings: Secondary | ICD-10-CM

## 2016-01-19 DIAGNOSIS — R05 Cough: Secondary | ICD-10-CM | POA: Diagnosis not present

## 2016-01-19 LAB — URINALYSIS, ROUTINE W REFLEX MICROSCOPIC
Bilirubin Urine: NEGATIVE
Ketones, ur: NEGATIVE
Nitrite: NEGATIVE
SPECIFIC GRAVITY, URINE: 1.015 (ref 1.000–1.030)
TOTAL PROTEIN, URINE-UPE24: NEGATIVE
URINE GLUCOSE: NEGATIVE
UROBILINOGEN UA: 0.2 (ref 0.0–1.0)
pH: 7.5 (ref 5.0–8.0)

## 2016-01-19 LAB — BASIC METABOLIC PANEL
BUN: 13 mg/dL (ref 6–23)
CALCIUM: 9.8 mg/dL (ref 8.4–10.5)
CO2: 27 mEq/L (ref 19–32)
Chloride: 108 mEq/L (ref 96–112)
Creatinine, Ser: 0.8 mg/dL (ref 0.40–1.20)
GFR: 91.63 mL/min (ref >60.00)
GLUCOSE: 102 mg/dL — AB (ref 70–99)
Potassium: 3.9 mEq/L (ref 3.5–5.1)
SODIUM: 140 meq/L (ref 135–145)

## 2016-01-19 LAB — LIPID PANEL
CHOL/HDL RATIO: 2
Cholesterol: 164 mg/dL (ref 0–200)
HDL: 68.6 mg/dL (ref >39.00)
LDL Cholesterol: 86 mg/dL (ref 0–99)
NONHDL: 95.85
Triglycerides: 49 mg/dL (ref 0.0–149.0)
VLDL: 9.8 mg/dL (ref 0.0–40.0)

## 2016-01-19 LAB — TSH: TSH: 1.42 u[IU]/mL (ref 0.35–4.50)

## 2016-01-19 LAB — CBC WITH DIFFERENTIAL/PLATELET
BASOS ABS: 0 10*3/uL (ref 0.0–0.1)
Basophils Relative: 0.7 % (ref 0.0–3.0)
EOS ABS: 0.2 10*3/uL (ref 0.0–0.7)
Eosinophils Relative: 6.1 % — ABNORMAL HIGH (ref 0.0–5.0)
HCT: 36.5 % (ref 36.0–46.0)
Hemoglobin: 12.3 g/dL (ref 12.0–15.0)
LYMPHS ABS: 1.8 10*3/uL (ref 0.7–4.0)
LYMPHS PCT: 48.1 % — AB (ref 12.0–46.0)
MCHC: 33.6 g/dL (ref 30.0–36.0)
MCV: 91.3 fl (ref 78.0–100.0)
MONOS PCT: 9.7 % (ref 3.0–12.0)
Monocytes Absolute: 0.4 10*3/uL (ref 0.1–1.0)
NEUTROS PCT: 35.4 % — AB (ref 43.0–77.0)
Neutro Abs: 1.3 10*3/uL — ABNORMAL LOW (ref 1.4–7.7)
Platelets: 211 10*3/uL (ref 150.0–400.0)
RBC: 4 Mil/uL (ref 3.87–5.11)
RDW: 12.2 % (ref 11.5–15.5)
WBC: 3.6 10*3/uL — ABNORMAL LOW (ref 4.0–10.5)

## 2016-01-19 LAB — HEPATIC FUNCTION PANEL
ALBUMIN: 4 g/dL (ref 3.5–5.2)
ALK PHOS: 74 U/L (ref 39–117)
ALT: 14 U/L (ref 0–35)
AST: 16 U/L (ref 0–37)
BILIRUBIN DIRECT: 0.1 mg/dL (ref 0.0–0.3)
BILIRUBIN TOTAL: 0.6 mg/dL (ref 0.2–1.2)
Total Protein: 6.9 g/dL (ref 6.0–8.3)

## 2016-01-19 LAB — VITAMIN D 25 HYDROXY (VIT D DEFICIENCY, FRACTURES): VITD: 13.94 ng/mL — AB (ref 30.00–100.00)

## 2016-01-19 NOTE — Progress Notes (Signed)
Pre visit review using our clinic review tool, if applicable. No additional management support is needed unless otherwise documented below in the visit note. 

## 2016-01-19 NOTE — Assessment & Plan Note (Signed)
Vit D Risks associated with treatment noncompliance were discussed. Compliance was encouraged.  

## 2016-01-19 NOTE — Assessment & Plan Note (Addendum)
We discussed age appropriate health related issues, including available/recomended screening tests and vaccinations. We discussed a need for adhering to healthy diet and exercise. Labs/EKG were reviewed/ordered. All questions were answered. Eye exa PAP per GYN Dr Audie BoxFontaine, mammo Cologuard (-) 2015 CXR

## 2016-01-20 ENCOUNTER — Other Ambulatory Visit: Payer: Self-pay | Admitting: Internal Medicine

## 2016-01-20 LAB — HEPATITIS C ANTIBODY: HCV Ab: NEGATIVE

## 2016-01-20 MED ORDER — ERGOCALCIFEROL 1.25 MG (50000 UT) PO CAPS: 50000.0000 [IU] | ORAL_CAPSULE | ORAL | Status: DC

## 2016-01-20 MED ORDER — CHOLECALCIFEROL 25 MCG (1000 UT) PO TABS: 2000.0000 [IU] | ORAL_TABLET | Freq: Every day | ORAL | Status: DC

## 2016-02-04 MED FILL — VIT D2 1.25 MG (50,000 UNIT: 1.25 MG | 42 days supply | Qty: 6 | Fill #0

## 2016-02-23 ENCOUNTER — Telehealth: Payer: Self-pay | Admitting: *Deleted

## 2016-02-23 NOTE — Telephone Encounter (Signed)
Pt states she got a call late yesterday afternoon and she was informed she has an appt on [address] she was not aware of. I called pt's home phone and left a message with Tad Mooreddie Lecuyer informing him, our office did not have pt set up for any appts nor had she been seen in our office, I encouraged him to have pt call Dr. Posey ReaPlotnikov to see if he had set her up for any appts.  Mr. Aundria RudRogers states understanding.

## 2016-08-09 DIAGNOSIS — H524 Presbyopia: Secondary | ICD-10-CM | POA: Diagnosis not present

## 2017-01-19 ENCOUNTER — Encounter: Payer: 59 | Admitting: Internal Medicine

## 2017-01-24 ENCOUNTER — Encounter: Payer: Self-pay | Admitting: Internal Medicine

## 2017-01-24 ENCOUNTER — Other Ambulatory Visit (INDEPENDENT_AMBULATORY_CARE_PROVIDER_SITE_OTHER): Payer: 59

## 2017-01-24 ENCOUNTER — Ambulatory Visit (INDEPENDENT_AMBULATORY_CARE_PROVIDER_SITE_OTHER): Payer: 59 | Admitting: Internal Medicine

## 2017-01-24 VITALS — BP 144/86 | HR 61 | Temp 98.6°F | Ht 66.0 in | Wt 152.0 lb

## 2017-01-24 DIAGNOSIS — E559 Vitamin D deficiency, unspecified: Secondary | ICD-10-CM | POA: Diagnosis not present

## 2017-01-24 DIAGNOSIS — Z1239 Encounter for other screening for malignant neoplasm of breast: Secondary | ICD-10-CM

## 2017-01-24 DIAGNOSIS — Z0000 Encounter for general adult medical examination without abnormal findings: Secondary | ICD-10-CM | POA: Diagnosis not present

## 2017-01-24 LAB — CBC WITH DIFFERENTIAL/PLATELET
BASOS PCT: 0.5 % (ref 0.0–3.0)
Basophils Absolute: 0 10*3/uL (ref 0.0–0.1)
EOS ABS: 0.2 10*3/uL (ref 0.0–0.7)
EOS PCT: 3.2 % (ref 0.0–5.0)
HEMATOCRIT: 34.7 % — AB (ref 36.0–46.0)
Hemoglobin: 11.9 g/dL — ABNORMAL LOW (ref 12.0–15.0)
LYMPHS PCT: 47.4 % — AB (ref 12.0–46.0)
Lymphs Abs: 2.3 10*3/uL (ref 0.7–4.0)
MCHC: 34.4 g/dL (ref 30.0–36.0)
MCV: 91.1 fl (ref 78.0–100.0)
MONO ABS: 0.3 10*3/uL (ref 0.1–1.0)
Monocytes Relative: 7 % (ref 3.0–12.0)
Neutro Abs: 2 10*3/uL (ref 1.4–7.7)
Neutrophils Relative %: 41.9 % — ABNORMAL LOW (ref 43.0–77.0)
Platelets: 206 10*3/uL (ref 150.0–400.0)
RBC: 3.81 Mil/uL — AB (ref 3.87–5.11)
RDW: 12.5 % (ref 11.5–15.5)
WBC: 4.8 10*3/uL (ref 4.0–10.5)

## 2017-01-24 LAB — LIPID PANEL
CHOL/HDL RATIO: 3
Cholesterol: 172 mg/dL (ref 0–200)
HDL: 68.4 mg/dL (ref >39.00)
LDL Cholesterol: 85 mg/dL (ref 0–99)
NonHDL: 103.28
Triglycerides: 92 mg/dL (ref 0.0–149.0)
VLDL: 18.4 mg/dL (ref 0.0–40.0)

## 2017-01-24 LAB — URINALYSIS, ROUTINE W REFLEX MICROSCOPIC
BILIRUBIN URINE: NEGATIVE
Ketones, ur: NEGATIVE
NITRITE: NEGATIVE
PH: 6 (ref 5.0–8.0)
Specific Gravity, Urine: >=1.03 — AB (ref 1.000–1.030)
TOTAL PROTEIN, URINE-UPE24: NEGATIVE
URINE GLUCOSE: NEGATIVE
UROBILINOGEN UA: 0.2 (ref 0.0–1.0)

## 2017-01-24 LAB — HEPATIC FUNCTION PANEL
ALT: 16 U/L (ref 0–35)
AST: 16 U/L (ref 0–37)
Albumin: 4.1 g/dL (ref 3.5–5.2)
Alkaline Phosphatase: 60 U/L (ref 39–117)
BILIRUBIN DIRECT: 0.1 mg/dL (ref 0.0–0.3)
BILIRUBIN TOTAL: 0.5 mg/dL (ref 0.2–1.2)
TOTAL PROTEIN: 6.8 g/dL (ref 6.0–8.3)

## 2017-01-24 LAB — TSH: TSH: 0.82 u[IU]/mL (ref 0.35–4.50)

## 2017-01-24 LAB — BASIC METABOLIC PANEL
BUN: 19 mg/dL (ref 6–23)
CO2: 26 mEq/L (ref 19–32)
Calcium: 9.6 mg/dL (ref 8.4–10.5)
Chloride: 109 mEq/L (ref 96–112)
Creatinine, Ser: 0.72 mg/dL (ref 0.40–1.20)
GFR: 103.17 mL/min (ref >60.00)
Glucose, Bld: 91 mg/dL (ref 70–99)
POTASSIUM: 3.5 meq/L (ref 3.5–5.1)
Sodium: 140 mEq/L (ref 135–145)

## 2017-01-24 LAB — VITAMIN D 25 HYDROXY (VIT D DEFICIENCY, FRACTURES): VITD: 22.14 ng/mL — AB (ref 30.00–100.00)

## 2017-01-24 NOTE — Patient Instructions (Addendum)
[facility]    Health Maintenance for Postmenopausal Women Menopause is a normal process in which your reproductive ability comes to an end. This process happens gradually over a span of months to years, usually between the ages of 37 and 53. Menopause is complete when you have missed 12 consecutive menstrual periods. It is important to talk with your health care provider about some of the most common conditions that affect postmenopausal women, such as heart disease, cancer, and bone loss (osteoporosis). Adopting a healthy lifestyle and getting preventive care can help to promote your health and wellness. Those actions can also lower your chances of developing some of these common conditions. What should I know about menopause? During menopause, you may experience a number of symptoms, such as:  Moderate-to-severe hot flashes.  Night sweats.  Decrease in sex drive.  Mood swings.  Headaches.  Tiredness.  Irritability.  Memory problems.  Insomnia.  Choosing to treat or not to treat menopausal changes is an individual decision that you make with your health care provider. What should I know about hormone replacement therapy and supplements? Hormone therapy products are effective for treating symptoms that are associated with menopause, such as hot flashes and night sweats. Hormone replacement carries certain risks, especially as you become older. If you are thinking about using estrogen or estrogen with progestin treatments, discuss the benefits and risks with your health care provider. What should I know about heart disease and stroke? Heart disease, heart attack, and stroke become more likely as you age. This may be due, in part, to the hormonal changes that your body experiences during menopause. These can affect how your body processes dietary fats, triglycerides, and cholesterol. Heart attack and stroke are both medical emergencies. There are many things that you can do to help  prevent heart disease and stroke:  Have your blood pressure checked at least every 1-2 years. High blood pressure causes heart disease and increases the risk of stroke.  If you are 54-28 years old, ask your health care provider if you should take aspirin to prevent a heart attack or a stroke.  Do not use any tobacco products, including cigarettes, chewing tobacco, or electronic cigarettes. If you need help quitting, ask your health care provider.  It is important to eat a healthy diet and maintain a healthy weight. ? Be sure to include plenty of vegetables, fruits, low-fat dairy products, and lean protein. ? Avoid eating foods that are high in solid fats, added sugars, or salt (sodium).  Get regular exercise. This is one of the most important things that you can do for your health. ? Try to exercise for at least 150 minutes each week. The type of exercise that you do should increase your heart rate and make you sweat. This is known as moderate-intensity exercise. ? Try to do strengthening exercises at least twice each week. Do these in addition to the moderate-intensity exercise.  Know your numbers.Ask your health care provider to check your cholesterol and your blood glucose. Continue to have your blood tested as directed by your health care provider.  What should I know about cancer screening? There are several types of cancer. Take the following steps to reduce your risk and to catch any cancer development as early as possible. Breast Cancer  Practice breast self-awareness. ? This means understanding how your breasts normally appear and feel. ? It also means doing regular breast self-exams. Let your health care provider know about any changes, no matter how small.  you are 40 or older, have a clinician do a breast exam (clinical breast exam or CBE) every year. Depending on your age, family history, and medical history, it may be recommended that you also have a yearly breast X-ray  (mammogram).  If you have a family history of breast cancer, talk with your health care provider about genetic screening.  If you are at high risk for breast cancer, talk with your health care provider about having an MRI and a mammogram every year.  Breast cancer (BRCA) gene test is recommended for women who have family members with BRCA-related cancers. Results of the assessment will determine the need for genetic counseling and BRCA1 and for BRCA2 testing. BRCA-related cancers include these types: ? Breast. This occurs in males or females. ? Ovarian. ? Tubal. This may also be called fallopian tube cancer. ? Cancer of the abdominal or pelvic lining (peritoneal cancer). ? Prostate. ? Pancreatic.  Cervical, Uterine, and Ovarian Cancer Your health care provider may recommend that you be screened regularly for cancer of the pelvic organs. These include your ovaries, uterus, and vagina. This screening involves a pelvic exam, which includes checking for microscopic changes to the surface of your cervix (Pap test).  For women ages 21-65, health care providers may recommend a pelvic exam and a Pap test every three years. For women ages 30-65, they may recommend the Pap test and pelvic exam, combined with testing for human papilloma virus (HPV), every five years. Some types of HPV increase your risk of cervical cancer. Testing for HPV may also be done on women of any age who have unclear Pap test results.  Other health care providers may not recommend any screening for nonpregnant women who are considered low risk for pelvic cancer and have no symptoms. Ask your health care provider if a screening pelvic exam is right for you.  If you have had past treatment for cervical cancer or a condition that could lead to cancer, you need Pap tests and screening for cancer for at least 20 years after your treatment. If Pap tests have been discontinued for you, your risk factors (such as having a new sexual  partner) need to be reassessed to determine if you should start having screenings again. Some women have medical problems that increase the chance of getting cervical cancer. In these cases, your health care provider may recommend that you have screening and Pap tests more often.  If you have a family history of uterine cancer or ovarian cancer, talk with your health care provider about genetic screening.  If you have vaginal bleeding after reaching menopause, tell your health care provider.  There are currently no reliable tests available to screen for ovarian cancer.  Lung Cancer Lung cancer screening is recommended for adults 55-80 years old who are at high risk for lung cancer because of a history of smoking. A yearly low-dose CT scan of the lungs is recommended if you:  Currently smoke.  Have a history of at least 30 pack-years of smoking and you currently smoke or have quit within the past 15 years. A pack-year is smoking an average of one pack of cigarettes per day for one year.  Yearly screening should:  Continue until it has been 15 years since you quit.  Stop if you develop a health problem that would prevent you from having lung cancer treatment.  Colorectal Cancer  This type of cancer can be detected and can often be prevented.  Routine colorectal cancer screening   screening usually begins at age 50 and continues through age 75.  If you have risk factors for colon cancer, your health care provider may recommend that you be screened at an earlier age.  If you have a family history of colorectal cancer, talk with your health care provider about genetic screening.  Your health care provider may also recommend using home test kits to check for hidden blood in your stool.  A small camera at the end of a tube can be used to examine your colon directly (sigmoidoscopy or colonoscopy). This is done to check for the earliest forms of colorectal cancer.  Direct examination of the colon  should be repeated every 5-10 years until age 75. However, if early forms of precancerous polyps or small growths are found or if you have a family history or genetic risk for colorectal cancer, you may need to be screened more often.  Skin Cancer  Check your skin from head to toe regularly.  Monitor any moles. Be sure to tell your health care provider: ? About any new moles or changes in moles, especially if there is a change in a mole's shape or color. ? If you have a mole that is larger than the size of a pencil eraser.  If any of your family members has a history of skin cancer, especially at a young age, talk with your health care provider about genetic screening.  Always use sunscreen. Apply sunscreen liberally and repeatedly throughout the day.  Whenever you are outside, protect yourself by wearing long sleeves, pants, a wide-brimmed hat, and sunglasses.  What should I know about osteoporosis? Osteoporosis is a condition in which bone destruction happens more quickly than new bone creation. After menopause, you may be at an increased risk for osteoporosis. To help prevent osteoporosis or the bone fractures that can happen because of osteoporosis, the following is recommended:  If you are 19-50 years old, get at least 1,000 mg of calcium and at least 600 mg of vitamin D per day.  If you are older than age 50 but younger than age 70, get at least 1,200 mg of calcium and at least 600 mg of vitamin D per day.  If you are older than age 70, get at least 1,200 mg of calcium and at least 800 mg of vitamin D per day.  Smoking and excessive alcohol intake increase the risk of osteoporosis. Eat foods that are rich in calcium and vitamin D, and do weight-bearing exercises several times each week as directed by your health care provider. What should I know about how menopause affects my mental health? Depression may occur at any age, but it is more common as you become older. Common symptoms of  depression include:  Low or sad mood.  Changes in sleep patterns.  Changes in appetite or eating patterns.  Feeling an overall lack of motivation or enjoyment of activities that you previously enjoyed.  Frequent crying spells.  Talk with your health care provider if you think that you are experiencing depression. What should I know about immunizations? It is important that you get and maintain your immunizations. These include:  Tetanus, diphtheria, and pertussis (Tdap) booster vaccine.  Influenza every year before the flu season begins.  Pneumonia vaccine.  Shingles vaccine.  Your health care provider may also recommend other immunizations. This information is not intended to replace advice given to you by your health care provider. Make sure you discuss any questions you have with your health care   provider. Document Released: 09/23/2005 Document Revised: 02/19/2016 Document Reviewed: 05/05/2015 Elsevier Interactive Patient Education  2018 [employer].

## 2017-01-24 NOTE — Assessment & Plan Note (Addendum)
Here for medicare wellness/physical  Diet: heart healthy  Physical activity: not sedentary  Depression/mood screen: negative  Hearing: intact to whispered voice  Visual acuity: grossly normal w/glasses, performs annual eye exam  ADLs: capable  Fall risk: low to none  Home safety: good  Cognitive evaluation: intact to orientation, naming, recall and repetition  EOL planning: adv directives, full code/ I agree  I have personally reviewed and have noted  1. The patient's medical, surgical and social history  2. Their use of alcohol, tobacco or illicit drugs  3. Their current medications and supplements  4. The patient's functional ability including ADL's, fall risks, home safety risks and hearing or visual impairment.  5. Diet and physical activities  6. Evidence for depression or mood disorders 7. The roster of all physicians providing medical care to patient - is listed in the Snapshot section of the chart and reviewed today.    Today patient counseled on age appropriate routine health concerns for screening and prevention, each reviewed and up to date or declined. Immunizations reviewed and up to date or declined. Labs ordered and reviewed. Risk factors for depression reviewed and negative. Hearing function and visual acuity are intact. ADLs screened and addressed as needed. Functional ability and level of safety reviewed and appropriate. Education, counseling and referrals performed based on assessed risks today. Patient provided with a copy of personalized plan for preventive services.      PAP per GYN Dr Audie BoxFontaine Cologuard (-) 2015 Mammogram DEXA suggested

## 2017-01-24 NOTE — Progress Notes (Signed)
Subjective:  Patient ID: Sarah Sparks, female    DOB: 07-28-1946  Age: 71 y.o. MRN: [id number]  CC: No chief complaint on file.   HPI Nylia Gavina presents for a well exam  Outpatient Medications Prior to Visit  Medication Sig Dispense Refill  . Cholecalciferol 1000 units tablet Take 2 tablets (2,000 Units total) by mouth daily. 100 tablet 3  . cyanocobalamin 1000 MCG tablet Take 100 mcg by mouth daily.    . ergocalciferol (VITAMIN D2) 50000 units capsule Take 1 capsule (50,000 Units total) by mouth once a week. 6 capsule 0  . loratadine (CLARITIN) 10 MG tablet Take 1 tablet (10 mg total) by mouth daily. 100 tablet 3   No facility-administered medications prior to visit.     ROS Review of Systems  Constitutional: Negative for activity change, appetite change, chills, fatigue and unexpected weight change.  HENT: Negative for congestion, mouth sores and sinus pressure.   Eyes: Negative for visual disturbance.  Respiratory: Negative for cough and chest tightness.   Gastrointestinal: Negative for abdominal pain and nausea.  Genitourinary: Negative for difficulty urinating, frequency and vaginal pain.  Musculoskeletal: Negative for back pain and gait problem.  Skin: Negative for pallor and rash.  Neurological: Negative for dizziness, tremors, weakness, numbness and headaches.  Psychiatric/Behavioral: Negative for confusion and sleep disturbance.    Objective:  BP (!) 144/86 (BP Location: Left Arm, Patient Position: Sitting, Cuff Size: Normal)   Pulse 61   Temp 98.6 F (37 C) (Oral)   Ht 5\' 6"  (1.676 m)   Wt 152 lb (68.9 kg)   SpO2 99%   BMI 24.53 kg/m   BP Readings from Last 3 Encounters:  01/24/17 (!) 144/86  01/19/16 130/78  01/15/15 118/68    Wt Readings from Last 3 Encounters:  01/24/17 152 lb (68.9 kg)  01/19/16 152 lb (68.9 kg)  01/15/15 156 lb (70.8 kg)    Physical Exam  Constitutional: She appears well-developed. No distress.  HENT:  Head:  Normocephalic.  Right Ear: External ear normal.  Left Ear: External ear normal.  Nose: Nose normal.  Mouth/Throat: Oropharynx is clear and moist.  Eyes: Conjunctivae are normal. Pupils are equal, round, and reactive to light. Right eye exhibits no discharge. Left eye exhibits no discharge.  Neck: Normal range of motion. Neck supple. No JVD present. No tracheal deviation present. No thyromegaly present.  Cardiovascular: Normal rate, regular rhythm and normal heart sounds.   Pulmonary/Chest: No stridor. No respiratory distress. She has no wheezes.  Abdominal: Soft. Bowel sounds are normal. She exhibits no distension and no mass. There is no tenderness. There is no rebound and no guarding.  Musculoskeletal: She exhibits no edema or tenderness.  Lymphadenopathy:    She has no cervical adenopathy.  Neurological: She displays normal reflexes. No cranial nerve deficit. She exhibits normal muscle tone. Coordination normal.  Skin: No rash noted. No erythema.  Psychiatric: She has a normal mood and affect. Her behavior is normal. Judgment and thought content normal.    Lab Results  Component Value Date   WBC 3.6 (L) 01/19/2016   HGB 12.3 01/19/2016   HCT 36.5 01/19/2016   PLT 211.0 01/19/2016   GLUCOSE 102 (H) 01/19/2016   CHOL 164 01/19/2016   TRIG 49.0 01/19/2016   HDL 68.60 01/19/2016   LDLCALC 86 01/19/2016   ALT 14 01/19/2016   AST 16 01/19/2016   NA 140 01/19/2016   K 3.9 01/19/2016   CL 108 01/19/2016   CREATININE 0.80  01/19/2016   BUN 13 01/19/2016   CO2 27 01/19/2016   TSH 1.42 01/19/2016    Dg Chest 2 View  Result Date: 01/19/2016 CLINICAL DATA:  Annual physical examination.  Cough. EXAM: CHEST  2 VIEW COMPARISON:  10/04/2010 chest radiograph. FINDINGS: Stable cardiomediastinal silhouette with normal heart size. No pneumothorax. No pleural effusion. Lungs appear clear, with no acute consolidative airspace disease and no pulmonary edema. IMPRESSION: No active cardiopulmonary  disease. Electronically Signed   By: Delbert PhenixJason A Poff M.D.   On: 01/19/2016 09:22    Assessment & Plan:   There are no diagnoses linked to this encounter. I have discontinued Ms. Sarver's cyanocobalamin, loratadine, and ergocalciferol. I am also having her maintain her Cholecalciferol.  No orders of the defined types were placed in this encounter.    Follow-up: No Follow-up on file.  Sonda PrimesAlex Mc Hollen, MD

## 2017-01-24 NOTE — Assessment & Plan Note (Signed)
Vit D 

## 2017-01-26 ENCOUNTER — Other Ambulatory Visit: Payer: Self-pay | Admitting: Internal Medicine

## 2017-01-26 MED ORDER — CIPROFLOXACIN HCL 250 MG PO TABS: 250.0000 mg | ORAL_TABLET | Freq: Two times a day (BID) | ORAL | 0 refills | Status: DC

## 2017-01-26 MED FILL — CIPROFLOXACIN HCL 250 MG TA: 250 | 4 days supply | Qty: 8 | Fill #0

## 2017-03-23 ENCOUNTER — Telehealth: Payer: Self-pay | Admitting: Internal Medicine

## 2017-03-23 NOTE — Telephone Encounter (Signed)
LVM with Pt, she called yesterday while the systems were down stating she had a missed call from the office, LVM notifying her systems are back up and to call back, no notes found in chart that someone callled her except maybe for her mammogram

## 2017-08-24 DIAGNOSIS — H524 Presbyopia: Secondary | ICD-10-CM | POA: Diagnosis not present

## 2017-11-21 ENCOUNTER — Other Ambulatory Visit: Payer: Self-pay

## 2017-11-21 ENCOUNTER — Encounter (HOSPITAL_COMMUNITY): Payer: Self-pay | Admitting: Emergency Medicine

## 2017-11-21 ENCOUNTER — Ambulatory Visit (HOSPITAL_COMMUNITY)
Admission: EM | Admit: 2017-11-21 | Discharge: 2017-11-21 | Disposition: A | Discharge status: Home / Self Care | Payer: 59 | Attending: Family Medicine | Admitting: Family Medicine

## 2017-11-21 ENCOUNTER — Ambulatory Visit (INDEPENDENT_AMBULATORY_CARE_PROVIDER_SITE_OTHER): Payer: 59

## 2017-11-21 DIAGNOSIS — M25511 Pain in right shoulder: Secondary | ICD-10-CM | POA: Diagnosis not present

## 2017-11-21 MED ORDER — IBUPROFEN 800 MG PO TABS: 800.0000 mg | ORAL_TABLET | Freq: Three times a day (TID) | ORAL | 0 refills | Status: AC

## 2017-11-21 NOTE — ED Provider Notes (Addendum)
[facility]    CSN: [id number] Arrival date & time: 11/21/17  1301  History   Chief Complaint Chief Complaint  Patient presents with  . Shoulder Injury    right    HPI Sarah Sparks is a 72 y.o. female.   With no significant PMH, presents today for right shoulder pain onset 3 days ago. 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  She reports immediate pain and pain is gradually getting worse. Pain is localized at the right shoulder without radiation. Denies numbness or tingling. Endorses limited ROM and swelling. Patient have tried to iced it with no relief.      Past Medical History:  Diagnosis Date  . Microscopic hematuria    DR. PETERSON    Patient Active Problem List   Diagnosis Date Noted  . Well adult exam 12/23/2013  . Vitamin D deficiency 12/21/2012  . Microscopic hematuria     Past Surgical History:  Procedure Laterality Date  . BUNIONECTOMY      OB History    Gravida  2   Para  2   Term  2   Preterm      AB      Living  1     SAB      TAB      Ectopic      Multiple      Live Births               Home Medications    Prior to Admission medications   Medication Sig Start Date End Date Taking? Authorizing Provider  Cholecalciferol 1000 units tablet Take 2 tablets (2,000 Units total) by mouth daily. 01/20/16   Plotnikov, Georgina QuintAleksei V, MD  ciprofloxacin (CIPRO) 250 MG tablet Take 1 tablet (250 mg total) by mouth 2 (two) times daily. 01/26/17   Plotnikov, Georgina QuintAleksei V, MD  ibuprofen (ADVIL,MOTRIN) 800 MG tablet Take 1 tablet (800 mg total) by mouth 3 (three) times daily for 5 days. 11/21/17 11/26/17  Lucia EstelleZheng, Rosa Gambale, NP    Family History Family History  Problem Relation Age of Onset  . Hypertension Mother   . Stroke Mother   . Heart disease Paternal Grandfather     Social History Social History    Tobacco Use  . Smoking status: Never Smoker  . Smokeless tobacco: Never Used  Substance Use Topics  . Alcohol use: Yes    Comment: rare  . Drug use: No     Allergies   Patient has no known allergies.   Review of Systems Review of Systems  Constitutional:       See HPI     Physical Exam Triage Vital Signs ED Triage Vitals  Enc Vitals Group     BP 11/21/17 1312 101/62     Pulse Rate 11/21/17 1312 70     Resp --      Temp 11/21/17 1312 98.9 F (37.2 C)     Temp Source 11/21/17 1312 Oral     SpO2 11/21/17 1312 97 %     Weight --      Height --      Head Circumference --      Peak Flow --      Pain Score 11/21/17 1310 8     Pain Loc --      Pain Edu? --  Excl. in GC? --    No data found.  Updated Vital Signs BP 101/62 (BP Location: Left Arm)   Pulse 70   Temp 98.9 F (37.2 C) (Oral)   SpO2 97%    Physical Exam  Constitutional: She is oriented to person, place, and time. She appears well-developed and well-nourished.  Cardiovascular: Normal rate.  Pulmonary/Chest: Effort normal.  Musculoskeletal:  Right shoulder is symmetrical compared to left. No significant swelling noted. No deformity noted. Have limited ROM due to pain. Exam was somewhat limited due to her pain and limited ROM. She was unable to lift her arm pass her breast line.   Neurological: She is alert and oriented to person, place, and time.  Nursing note and vitals reviewed.    UC Treatments / Results  Labs (all labs ordered are listed, but only abnormal results are displayed) Labs Reviewed - No data to display  EKG None Radiology Dg Shoulder Right  Result Date: 11/21/2017 CLINICAL DATA:  Right shoulder pain after injury 3 days ago. EXAM: RIGHT SHOULDER - 2+ VIEW COMPARISON:  None. FINDINGS: There is no evidence of fracture or dislocation. There is no evidence of arthropathy or other focal bone abnormality. Soft tissues are unremarkable. IMPRESSION: Normal right shoulder.  Electronically Signed   By: Lupita Raider, M.D.   On: 11/21/2017 13:46    Procedures Procedures (including critical care time)  Medications Ordered in UC Medications - No data to display   Initial Impression / Assessment and Plan / UC Course  I have reviewed the triage vital signs and the nursing notes.  Pertinent labs & imaging results that were available during my care of the patient were reviewed by me and considered in my medical decision making (see chart for details).  Final Clinical Impressions(s) / UC Diagnoses   Final diagnoses:  Acute pain of right shoulder   Shoulder xray was unremarkable. Discussed result with patient. Rotator cuff injury/tear is in differential but MRI is needed to evaluate this. Conservative treatment is recommended anyway if it is a rotator cuff injury/tear. Advised patient to take ibuprofen as needed for pain, continue with ice, rest the area. If persist, then please f/u with PCP or Orthopedic specialist.    ED Discharge Orders        Ordered    ibuprofen (ADVIL,MOTRIN) 800 MG tablet  3 times daily     11/21/17 1359     Controlled Substance Prescriptions  Controlled Substance Registry consulted? Not Applicable   Lucia Estelle, NP 11/21/17 1359    Lucia Estelle, NP 11/21/17 [phone]

## 2017-11-21 NOTE — ED Triage Notes (Signed)
Pt reports trying to pull something heavy in front of her on Sunday when she felt pain in her shoulder.  She has been suffering from limited ROM and swelling in the shoulder.

## 2017-12-07 ENCOUNTER — Telehealth: Payer: Self-pay | Admitting: Internal Medicine

## 2017-12-07 NOTE — Telephone Encounter (Signed)
Okay to reorder with Dr. Loren RacerPlotnikov's approval.

## 2017-12-07 NOTE — Telephone Encounter (Signed)
Last Cologurad: 5/ 11/ 2015 LOV: 6/ 12/ 2018 Next appt: 6/ 13/ 2019 Would you like to reorder cologuard?

## 2018-01-25 ENCOUNTER — Other Ambulatory Visit (INDEPENDENT_AMBULATORY_CARE_PROVIDER_SITE_OTHER): Payer: 59

## 2018-01-25 ENCOUNTER — Encounter: Payer: Self-pay | Admitting: Internal Medicine

## 2018-01-25 ENCOUNTER — Ambulatory Visit (INDEPENDENT_AMBULATORY_CARE_PROVIDER_SITE_OTHER): Payer: 59 | Admitting: Internal Medicine

## 2018-01-25 VITALS — BP 132/76 | HR 75 | Temp 98.7°F | Ht 66.0 in | Wt 152.0 lb

## 2018-01-25 DIAGNOSIS — E559 Vitamin D deficiency, unspecified: Secondary | ICD-10-CM | POA: Diagnosis not present

## 2018-01-25 DIAGNOSIS — Z0000 Encounter for general adult medical examination without abnormal findings: Secondary | ICD-10-CM

## 2018-01-25 DIAGNOSIS — R002 Palpitations: Secondary | ICD-10-CM

## 2018-01-25 DIAGNOSIS — I499 Cardiac arrhythmia, unspecified: Secondary | ICD-10-CM | POA: Diagnosis not present

## 2018-01-25 LAB — HEPATIC FUNCTION PANEL
ALK PHOS: 68 U/L (ref 39–117)
ALT: 15 U/L (ref 0–35)
AST: 15 U/L (ref 0–37)
Albumin: 4 g/dL (ref 3.5–5.2)
BILIRUBIN DIRECT: 0.1 mg/dL (ref 0.0–0.3)
BILIRUBIN TOTAL: 0.7 mg/dL (ref 0.2–1.2)
Total Protein: 7.2 g/dL (ref 6.0–8.3)

## 2018-01-25 LAB — URINALYSIS, ROUTINE W REFLEX MICROSCOPIC
Bilirubin Urine: NEGATIVE
KETONES UR: NEGATIVE
Nitrite: NEGATIVE
SPECIFIC GRAVITY, URINE: 1.025 (ref 1.000–1.030)
TOTAL PROTEIN, URINE-UPE24: NEGATIVE
URINE GLUCOSE: NEGATIVE
UROBILINOGEN UA: 0.2 (ref 0.0–1.0)
pH: 5.5 (ref 5.0–8.0)

## 2018-01-25 LAB — CBC WITH DIFFERENTIAL/PLATELET
BASOS ABS: 0 10*3/uL (ref 0.0–0.1)
Basophils Relative: 0.5 % (ref 0.0–3.0)
EOS ABS: 0.1 10*3/uL (ref 0.0–0.7)
Eosinophils Relative: 2.1 % (ref 0.0–5.0)
HEMATOCRIT: 35.5 % — AB (ref 36.0–46.0)
Hemoglobin: 12 g/dL (ref 12.0–15.0)
LYMPHS ABS: 2.1 10*3/uL (ref 0.7–4.0)
LYMPHS PCT: 40 % (ref 12.0–46.0)
MCHC: 33.9 g/dL (ref 30.0–36.0)
MCV: 89.9 fl (ref 78.0–100.0)
MONOS PCT: 8.3 % (ref 3.0–12.0)
Monocytes Absolute: 0.4 10*3/uL (ref 0.1–1.0)
NEUTROS PCT: 49.1 % (ref 43.0–77.0)
Neutro Abs: 2.6 10*3/uL (ref 1.4–7.7)
Platelets: 242 10*3/uL (ref 150.0–400.0)
RBC: 3.94 Mil/uL (ref 3.87–5.11)
RDW: 12.8 % (ref 11.5–15.5)
WBC: 5.3 10*3/uL (ref 4.0–10.5)

## 2018-01-25 LAB — BASIC METABOLIC PANEL
BUN: 16 mg/dL (ref 6–23)
CALCIUM: 9.6 mg/dL (ref 8.4–10.5)
CO2: 25 meq/L (ref 19–32)
CREATININE: 0.77 mg/dL (ref 0.40–1.20)
Chloride: 108 mEq/L (ref 96–112)
GFR: 95.2 mL/min (ref >60.00)
GLUCOSE: 94 mg/dL (ref 70–99)
Potassium: 3.7 mEq/L (ref 3.5–5.1)
Sodium: 140 mEq/L (ref 135–145)

## 2018-01-25 LAB — LIPID PANEL
CHOL/HDL RATIO: 2
Cholesterol: 161 mg/dL (ref 0–200)
HDL: 69.5 mg/dL (ref >39.00)
LDL Cholesterol: 80 mg/dL (ref 0–99)
NONHDL: 91.38
Triglycerides: 58 mg/dL (ref 0.0–149.0)
VLDL: 11.6 mg/dL (ref 0.0–40.0)

## 2018-01-25 LAB — TSH: TSH: 0.42 u[IU]/mL (ref 0.35–4.50)

## 2018-01-25 NOTE — Patient Instructions (Addendum)
Radon test kit for the basement

## 2018-01-25 NOTE — Assessment & Plan Note (Signed)
On Vit D 

## 2018-01-25 NOTE — Assessment & Plan Note (Addendum)
We discussed age appropriate health related issues, including available/recomended screening tests and vaccinations. We discussed a need for adhering to healthy diet and exercise. Labs were ordered to be later reviewed . All questions were answered.  Cologuard Pt declined mammogram, DEXA, PAP Eye exam Labs [facility]

## 2018-01-25 NOTE — Progress Notes (Signed)
Subjective:  Patient ID: Sarah Sparks, female    DOB: 10-22-1945  Age: 72 y.o. MRN: [id number]  CC: No chief complaint on file.   HPI Sarah Sparks presents for a well exam  Outpatient Medications Prior to Visit  Medication Sig Dispense Refill  . Cholecalciferol 1000 units tablet Take 2 tablets (2,000 Units total) by mouth daily. 100 tablet 3  . ciprofloxacin (CIPRO) 250 MG tablet Take 1 tablet (250 mg total) by mouth 2 (two) times daily. 8 tablet 0   No facility-administered medications prior to visit.     ROS: Review of Systems  Constitutional: Negative for activity change, appetite change, chills, fatigue and unexpected weight change.  HENT: Negative for congestion, mouth sores and sinus pressure.   Eyes: Negative for visual disturbance.  Respiratory: Negative for cough and chest tightness.   Gastrointestinal: Negative for abdominal pain and nausea.  Genitourinary: Negative for difficulty urinating, frequency and vaginal pain.  Musculoskeletal: Negative for back pain and gait problem.  Skin: Negative for pallor and rash.  Neurological: Negative for dizziness, tremors, weakness, numbness and headaches.  Psychiatric/Behavioral: Negative for confusion, sleep disturbance and suicidal ideas. The patient is not nervous/anxious.     Objective:  BP 132/76 (BP Location: Left Arm, Patient Position: Sitting, Cuff Size: Normal)   Pulse 75   Temp 98.7 F (37.1 C) (Oral)   Ht 5\' 6"  (1.676 m)   Wt 152 lb (68.9 kg)   SpO2 98%   BMI 24.53 kg/m   BP Readings from Last 3 Encounters:  01/25/18 132/76  11/21/17 101/62  01/24/17 (!) 144/86    Wt Readings from Last 3 Encounters:  01/25/18 152 lb (68.9 kg)  01/24/17 152 lb (68.9 kg)  01/19/16 152 lb (68.9 kg)    Physical Exam  Constitutional: She appears well-developed. No distress.  HENT:  Head: Normocephalic.  Right Ear: External ear normal.  Left Ear: External ear normal.  Nose: Nose normal.  Mouth/Throat: Oropharynx is  clear and moist.  Eyes: Pupils are equal, round, and reactive to light. Conjunctivae are normal. Right eye exhibits no discharge. Left eye exhibits no discharge.  Neck: Normal range of motion. Neck supple. No JVD present. No tracheal deviation present. No thyromegaly present.  Cardiovascular: Normal rate and normal heart sounds.  Pulmonary/Chest: No stridor. No respiratory distress. She has no wheezes.  Abdominal: Soft. Bowel sounds are normal. She exhibits no distension and no mass. There is no tenderness. There is no rebound and no guarding.  Musculoskeletal: She exhibits no edema or tenderness.  Lymphadenopathy:    She has no cervical adenopathy.  Neurological: She displays normal reflexes. No cranial nerve deficit. She exhibits normal muscle tone. Coordination normal.  Skin: No rash noted. No erythema.  Psychiatric: She has a normal mood and affect. Her behavior is normal. Judgment and thought content normal.  occ irreg beats   Procedure: EKG Indication: irreg beat Impression: NSR. No acute changes.  Lab Results  Component Value Date   WBC 4.8 01/24/2017   HGB 11.9 (L) 01/24/2017   HCT 34.7 (L) 01/24/2017   PLT 206.0 01/24/2017   GLUCOSE 91 01/24/2017   CHOL 172 01/24/2017   TRIG 92.0 01/24/2017   HDL 68.40 01/24/2017   LDLCALC 85 01/24/2017   ALT 16 01/24/2017   AST 16 01/24/2017   NA 140 01/24/2017   K 3.5 01/24/2017   CL 109 01/24/2017   CREATININE 0.72 01/24/2017   BUN 19 01/24/2017   CO2 26 01/24/2017   TSH 0.82  01/24/2017    Dg Shoulder Right  Result Date: 11/21/2017 CLINICAL DATA:  Right shoulder pain after injury 3 days ago. EXAM: RIGHT SHOULDER - 2+ VIEW COMPARISON:  None. FINDINGS: There is no evidence of fracture or dislocation. There is no evidence of arthropathy or other focal bone abnormality. Soft tissues are unremarkable. IMPRESSION: Normal right shoulder. Electronically Signed   By: Lupita Raider, M.D.   On: 11/21/2017 13:46    Assessment & Plan:    There are no diagnoses linked to this encounter.   No orders of the defined types were placed in this encounter.    Follow-up: No follow-ups on file.  Sonda Primes, MD

## 2018-01-25 NOTE — Assessment & Plan Note (Signed)
?  PACs asymptomatic EKG - nl

## 2018-02-27 DIAGNOSIS — Z1212 Encounter for screening for malignant neoplasm of rectum: Secondary | ICD-10-CM | POA: Diagnosis not present

## 2018-02-27 DIAGNOSIS — Z1211 Encounter for screening for malignant neoplasm of colon: Secondary | ICD-10-CM | POA: Diagnosis not present

## 2018-03-08 LAB — COLOGUARD: Cologuard: NEGATIVE

## 2018-03-22 ENCOUNTER — Telehealth: Payer: Self-pay

## 2018-03-22 NOTE — Telephone Encounter (Signed)
Patient would not let me schedule because she wants more info on this " new " shingle vaccine. Please call patient @ [phone]

## 2018-03-22 NOTE — Telephone Encounter (Signed)
Patient advised to call back to schedule nurse visit to get first shingrix vaccine---let tamara,RN at [facility] know when appt is made so that both vaccines can be labeled

## 2018-03-23 NOTE — Telephone Encounter (Signed)
Left message asking patient to call back at convenient time with shingrix questions

## 2018-03-29 ENCOUNTER — Encounter: Payer: Self-pay | Admitting: Internal Medicine

## 2018-06-05 ENCOUNTER — Ambulatory Visit: Payer: 59 | Admitting: Internal Medicine

## 2018-06-05 ENCOUNTER — Encounter: Payer: Self-pay | Admitting: Internal Medicine

## 2018-06-05 DIAGNOSIS — R42 Dizziness and giddiness: Secondary | ICD-10-CM

## 2018-06-05 DIAGNOSIS — Z566 Other physical and mental strain related to work: Secondary | ICD-10-CM

## 2018-06-05 MED ORDER — MECLIZINE HCL 12.5 MG PO TABS: 12.5000 mg | ORAL_TABLET | Freq: Three times a day (TID) | ORAL | 1 refills | Status: DC | PRN

## 2018-06-05 MED FILL — MECLIZINE 25 MG TABLET: 25 | 20 days supply | Qty: 30 | Fill #0

## 2018-06-05 NOTE — Assessment & Plan Note (Signed)
Benign Positional Vertigo symptoms Start Meclizine. Start Sarah Sparks - Daroff exercise several times a day as dirrected. Off work x 2 weeks. No driving

## 2018-06-05 NOTE — Patient Instructions (Signed)
Vertigo °Vertigo is the feeling that you or your surroundings are moving when they are not. Vertigo can be dangerous if it occurs while you are doing something that could endanger you or others, such as driving. °What are the causes? °This condition is caused by a disturbance in the signals that are sent by your body’s sensory systems to your brain. Different causes of a disturbance can lead to vertigo, including: °· Infections, especially in the inner ear. °· A bad reaction to a drug, or misuse of alcohol and medicines. °· Withdrawal from drugs or alcohol. °· Quickly changing positions, as when lying down or rolling over in bed. °· Migraine headaches. °· Decreased blood flow to the brain. °· Decreased blood pressure. °· Increased pressure in the brain from a head or neck injury, stroke, infection, tumor, or bleeding. °· Central nervous system disorders. ° °What are the signs or symptoms? °Symptoms of this condition usually occur when you move your head or your eyes in different directions. Symptoms may start suddenly, and they usually last for less than a minute. Symptoms may include: °· Loss of balance and falling. °· Feeling like you are spinning or moving. °· Feeling like your surroundings are spinning or moving. °· Nausea and vomiting. °· Blurred vision or double vision. °· Difficulty hearing. °· Slurred speech. °· Dizziness. °· Involuntary eye movement (nystagmus). ° °Symptoms can be mild and cause only slight annoyance, or they can be severe and interfere with daily life. Episodes of vertigo may return (recur) over time, and they are often triggered by certain movements. Symptoms may improve over time. °How is this diagnosed? °This condition may be diagnosed based on medical history and the quality of your nystagmus. Your health care provider may test your eye movements by asking you to quickly change positions to trigger the nystagmus. This may be called the Dix-Hallpike test, head thrust test, or roll test.  You may be referred to a health care provider who specializes in ear, nose, and throat (ENT) problems (otolaryngologist) or a provider who specializes in disorders of the central nervous system (neurologist). °You may have additional testing, including: °· A physical exam. °· Blood tests. °· MRI. °· A CT scan. °· An electrocardiogram (ECG). This records electrical activity in your heart. °· An electroencephalogram (EEG). This records electrical activity in your brain. °· Hearing tests. ° °How is this treated? °Treatment for this condition depends on the cause and the severity of the symptoms. Treatment options include: °· Medicines to treat nausea or vertigo. These are usually used for severe cases. Some medicines that are used to treat other conditions may also reduce or eliminate vertigo symptoms. These include: °? Medicines that control allergies (antihistamines). °? Medicines that control seizures (anticonvulsants). °? Medicines that relieve depression (antidepressants). °? Medicines that relieve anxiety (sedatives). °· Head movements to adjust your inner ear back to normal. If your vertigo is caused by an ear problem, your health care provider may recommend certain movements to correct the problem. °· Surgery. This is rare. ° °Follow these instructions at home: °Safety °· Move slowly.Avoid sudden body or head movements. °· Avoid driving. °· Avoid operating heavy machinery. °· Avoid doing any tasks that would cause danger to you or others if you would have a vertigo episode during the task. °· If you have trouble walking or keeping your balance, try using a cane for stability. If you feel dizzy or unstable, sit down right away. °· Return to your normal activities as told by your   health care provider. Ask your health care provider what activities are safe for you. °General instructions °· Take over-the-counter and prescription medicines only as told by your health care provider. °· Avoid certain positions or  movements as told by your health care provider. °· Drink enough fluid to keep your urine clear or pale yellow. °· Keep all follow-up visits as told by your health care provider. This is important. °Contact a health care provider if: °· Your medicines do not relieve your vertigo or they make it worse. °· You have a fever. °· Your condition gets worse or you develop new symptoms. °· Your family or friends notice any behavioral changes. °· Your nausea or vomiting gets worse. °· You have numbness or a “pins and needles” sensation in part of your body. °Get help right away if: °· You have difficulty moving or speaking. °· You are always dizzy. °· You faint. °· You develop severe headaches. °· You have weakness in your hands, arms, or legs. °· You have changes in your hearing or vision. °· You develop a stiff neck. °· You develop sensitivity to light. °This information is not intended to replace advice given to you by your health care provider. Make sure you discuss any questions you have with your health care provider. °Document Released: 05/11/2005 Document Revised: 01/13/2016 Document Reviewed: 11/24/2014 °Elsevier Interactive Patient Education © 2018 [employer] ° °

## 2018-06-05 NOTE — Progress Notes (Signed)
Subjective:  Patient ID: Sarah Sparks, female    DOB: 11/14/1945  Age: 72 y.o. MRN: [id number]  CC: No chief complaint on file.   HPI Sarah Sparks presents for dizziness since Sunday - episodic, severe. C/o stress at work. No HA, LOC etc. No n/v.   Outpatient Medications Prior to Visit  Medication Sig Dispense Refill  . Cholecalciferol 1000 units tablet Take 2 tablets (2,000 Units total) by mouth daily. 100 tablet 3   No facility-administered medications prior to visit.     ROS: Review of Systems  Constitutional: Negative for activity change, appetite change, chills, fatigue and unexpected weight change.  HENT: Negative for congestion, mouth sores and sinus pressure.   Eyes: Negative for visual disturbance.  Respiratory: Negative for cough and chest tightness.   Gastrointestinal: Negative for abdominal pain and nausea.  Genitourinary: Negative for difficulty urinating, frequency and vaginal pain.  Musculoskeletal: Negative for back pain and gait problem.  Skin: Negative for pallor and rash.  Neurological: Positive for dizziness. Negative for tremors, syncope, speech difficulty, weakness, light-headedness, numbness and headaches.  Psychiatric/Behavioral: Negative for confusion, sleep disturbance and suicidal ideas.    Objective:  BP 126/72 (BP Location: Left Arm, Patient Position: Standing, Cuff Size: Normal)   Pulse 78   Temp 98.2 F (36.8 C) (Oral)   Ht 5\' 6"  (1.676 m)   Wt 142 lb (64.4 kg)   SpO2 96%   BMI 22.92 kg/m   BP Readings from Last 3 Encounters:  06/05/18 126/72  01/25/18 132/76  11/21/17 101/62    Wt Readings from Last 3 Encounters:  06/05/18 142 lb (64.4 kg)  01/25/18 152 lb (68.9 kg)  01/24/17 152 lb (68.9 kg)    Physical Exam  Constitutional: She appears well-developed. No distress.  HENT:  Head: Normocephalic.  Right Ear: External ear normal.  Left Ear: External ear normal.  Nose: Nose normal.  Mouth/Throat: Oropharynx is clear and  moist.  Eyes: Pupils are equal, round, and reactive to light. Conjunctivae are normal. Right eye exhibits no discharge. Left eye exhibits no discharge.  Neck: Normal range of motion. Neck supple. No JVD present. No tracheal deviation present. No thyromegaly present.  Cardiovascular: Normal rate, regular rhythm and normal heart sounds.  Pulmonary/Chest: No stridor. No respiratory distress. She has no wheezes.  Abdominal: Soft. Bowel sounds are normal. She exhibits no distension and no mass. There is no tenderness. There is no rebound and no guarding.  Musculoskeletal: She exhibits no edema or tenderness.  Lymphadenopathy:    She has no cervical adenopathy.  Neurological: She displays normal reflexes. No cranial nerve deficit. She exhibits normal muscle tone. Coordination normal.  Skin: No rash noted. No erythema.  Psychiatric: She has a normal mood and affect. Her behavior is normal. Judgment and thought content normal.  H-P (-) B Romberg (-) No pron drift Some wax in the R ear   FTF>20 min discussing vertigo   Lab Results  Component Value Date   WBC 5.3 01/25/2018   HGB 12.0 01/25/2018   HCT 35.5 (L) 01/25/2018   PLT 242.0 01/25/2018   GLUCOSE 94 01/25/2018   CHOL 161 01/25/2018   TRIG 58.0 01/25/2018   HDL 69.50 01/25/2018   LDLCALC 80 01/25/2018   ALT 15 01/25/2018   AST 15 01/25/2018   NA 140 01/25/2018   K 3.7 01/25/2018   CL 108 01/25/2018   CREATININE 0.77 01/25/2018   BUN 16 01/25/2018   CO2 25 01/25/2018   TSH 0.42 01/25/2018  Dg Shoulder Right  Result Date: 11/21/2017 CLINICAL DATA:  Right shoulder pain after injury 3 days ago. EXAM: RIGHT SHOULDER - 2+ VIEW COMPARISON:  None. FINDINGS: There is no evidence of fracture or dislocation. There is no evidence of arthropathy or other focal bone abnormality. Soft tissues are unremarkable. IMPRESSION: Normal right shoulder. Electronically Signed   By: Lupita Raider, M.D.   On: 11/21/2017 13:46    Assessment &  Plan:   There are no diagnoses linked to this encounter.   No orders of the defined types were placed in this encounter.    Follow-up: No follow-ups on file.  Sonda Primes, MD

## 2018-06-05 NOTE — Assessment & Plan Note (Signed)
Off work x 2 wks

## 2018-06-07 ENCOUNTER — Telehealth: Payer: Self-pay | Admitting: Internal Medicine

## 2018-06-07 DIAGNOSIS — Z0279 Encounter for issue of other medical certificate: Secondary | ICD-10-CM

## 2018-06-07 NOTE — Telephone Encounter (Signed)
I received FMLA via fax today from [employer].   Forms have been completed for patient being out of work until 11/5& placed in providers box to sign.    Copied from CRM [id number]. Topic: General - Other >> Jun 07, 2018 12:41 PM Arlyss Gandy, NT wrote: Reason for CRM: Pt calling to check status on her FMLA forms and to make sure the office received them from [facility]. Please advise.

## 2018-06-11 NOTE — Telephone Encounter (Addendum)
Forms have been faxed to [employer] [phone], Copy sent to scan &charged for.   Patient inform and will pick up original at her appointment on 11/5

## 2018-06-18 IMAGING — DX DG SHOULDER 2+V*R*
3 series · 3 of 3 positions shown · non-contrast
Comparison: None.

CLINICAL DATA: Right shoulder pain after injury 3 days ago.

EXAM:
RIGHT SHOULDER - 2+ VIEW

[shoulder ap]
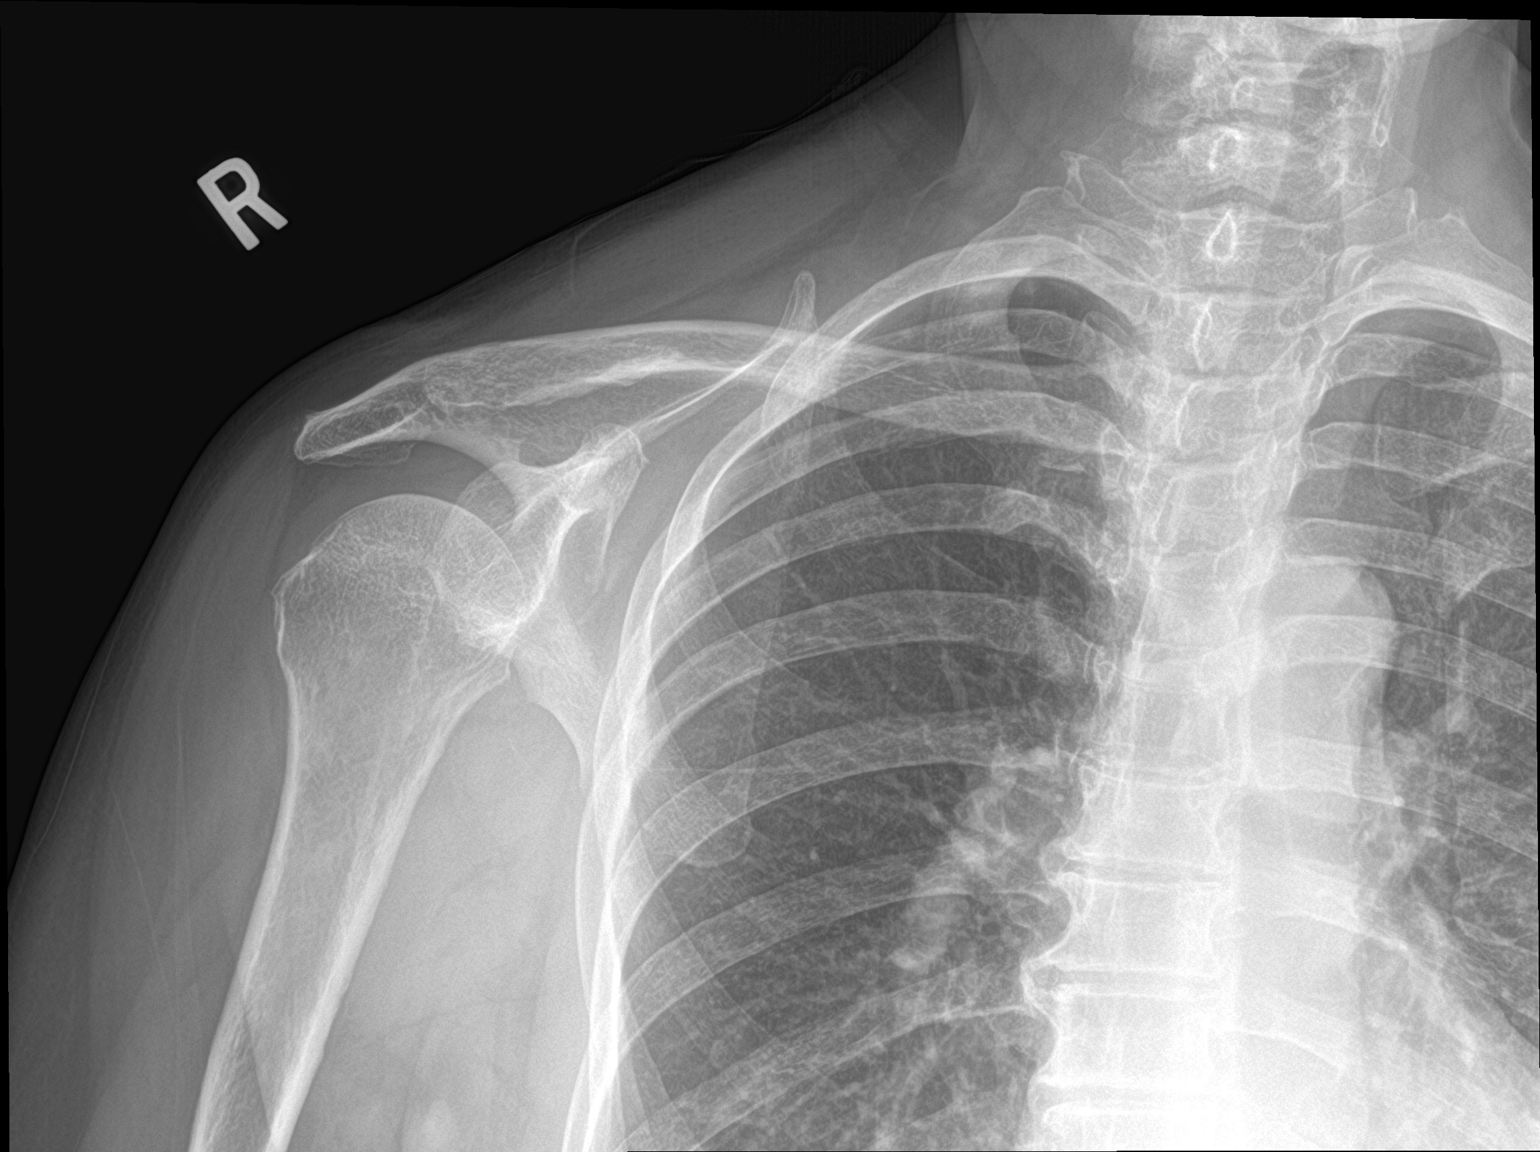

[shoulder grashey]
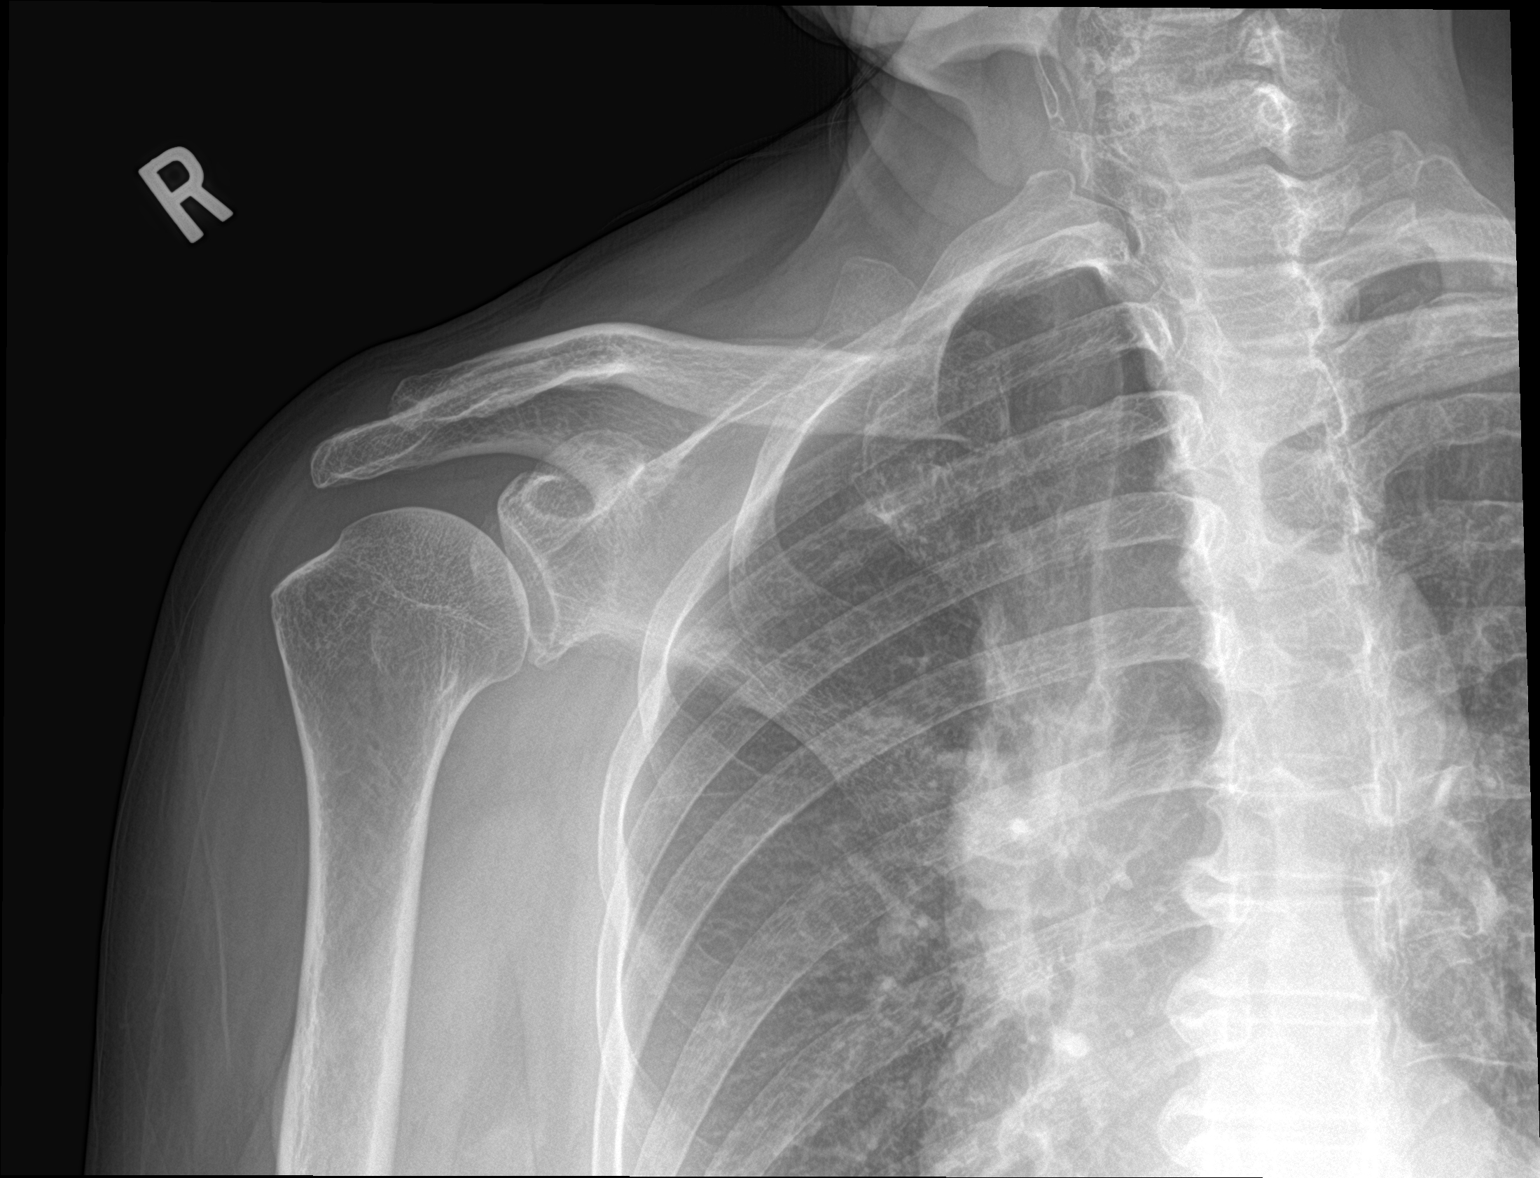

[shoulder y-view]
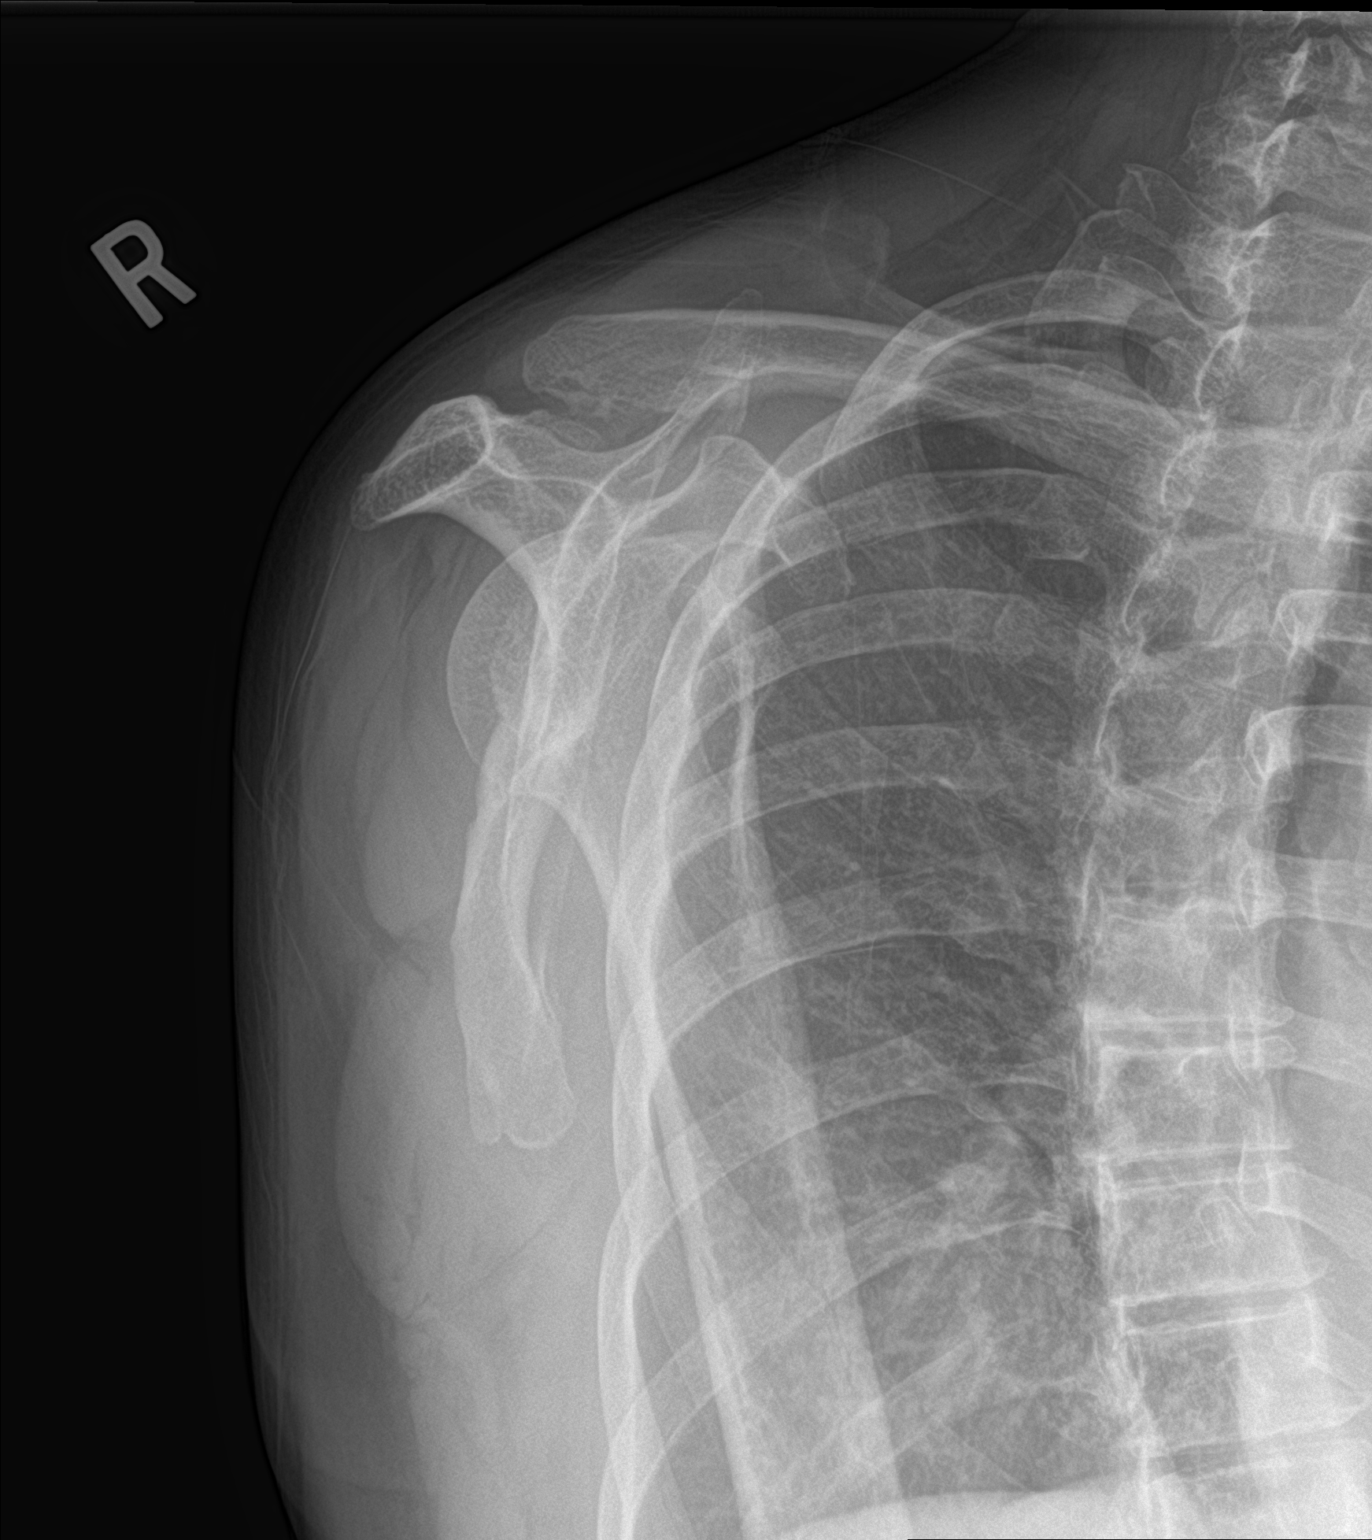

[3 of 3 positions shown; findings below may reference images not displayed]

FINDINGS: There is no evidence of fracture or dislocation. There is no
evidence of arthropathy or other focal bone abnormality. Soft
tissues are unremarkable.
IMPRESSION: Normal right shoulder.

## 2018-06-19 ENCOUNTER — Encounter: Payer: Self-pay | Admitting: Internal Medicine

## 2018-06-19 ENCOUNTER — Ambulatory Visit: Payer: 59 | Admitting: Internal Medicine

## 2018-06-19 DIAGNOSIS — R42 Dizziness and giddiness: Secondary | ICD-10-CM | POA: Diagnosis not present

## 2018-06-19 NOTE — Progress Notes (Signed)
Subjective:  Patient ID: Sarah Sparks, female    DOB: 29-Mar-1946  Age: 72 y.o. MRN: [id number]  CC: No chief complaint on file.   HPI Sarah Sparks presents for vertigo - much better F/u stress  Outpatient Medications Prior to Visit  Medication Sig Dispense Refill  . Cholecalciferol 1000 units tablet Take 2 tablets (2,000 Units total) by mouth daily. 100 tablet 3  . meclizine (ANTIVERT) 12.5 MG tablet Take 1 tablet (12.5 mg total) by mouth 3 (three) times daily as needed for dizziness. 60 tablet 1   No facility-administered medications prior to visit.     ROS: Review of Systems  Constitutional: Positive for fatigue.  HENT: Negative for congestion.   Cardiovascular: Negative for chest pain.  Neurological: Positive for dizziness. Negative for headaches.  Psychiatric/Behavioral: Positive for dysphoric mood. The patient is not nervous/anxious.     Objective:  BP 132/66 (BP Location: Left Arm, Patient Position: Sitting, Cuff Size: Normal)   Pulse 78   Temp 97.7 F (36.5 C) (Oral)   Ht 5\' 6"  (1.676 m)   Wt 141 lb (64 kg)   SpO2 98%   BMI 22.76 kg/m   BP Readings from Last 3 Encounters:  06/19/18 132/66  06/05/18 126/72  01/25/18 132/76    Wt Readings from Last 3 Encounters:  06/19/18 141 lb (64 kg)  06/05/18 142 lb (64.4 kg)  01/25/18 152 lb (68.9 kg)    Physical Exam  Constitutional: She appears well-developed. No distress.  HENT:  Head: Normocephalic.  Right Ear: External ear normal.  Left Ear: External ear normal.  Nose: Nose normal.  Mouth/Throat: Oropharynx is clear and moist.  Eyes: Pupils are equal, round, and reactive to light. Conjunctivae are normal. Right eye exhibits no discharge. Left eye exhibits no discharge.  Neck: Normal range of motion. Neck supple. No JVD present. No tracheal deviation present. No thyromegaly present.  Cardiovascular: Normal rate, regular rhythm and normal heart sounds.  Pulmonary/Chest: No stridor. No respiratory distress.  She has no wheezes.  Abdominal: Soft. Bowel sounds are normal. She exhibits no distension and no mass. There is no tenderness. There is no rebound and no guarding.  Musculoskeletal: She exhibits no edema or tenderness.  Lymphadenopathy:    She has no cervical adenopathy.  Neurological: She displays normal reflexes. No cranial nerve deficit. She exhibits normal muscle tone. Coordination normal.  Skin: No rash noted. No erythema.  Psychiatric: She has a normal mood and affect. Her behavior is normal. Judgment and thought content normal.    Lab Results  Component Value Date   WBC 5.3 01/25/2018   HGB 12.0 01/25/2018   HCT 35.5 (L) 01/25/2018   PLT 242.0 01/25/2018   GLUCOSE 94 01/25/2018   CHOL 161 01/25/2018   TRIG 58.0 01/25/2018   HDL 69.50 01/25/2018   LDLCALC 80 01/25/2018   ALT 15 01/25/2018   AST 15 01/25/2018   NA 140 01/25/2018   K 3.7 01/25/2018   CL 108 01/25/2018   CREATININE 0.77 01/25/2018   BUN 16 01/25/2018   CO2 25 01/25/2018   TSH 0.42 01/25/2018    Dg Shoulder Right  Result Date: 11/21/2017 CLINICAL DATA:  Right shoulder pain after injury 3 days ago. EXAM: RIGHT SHOULDER - 2+ VIEW COMPARISON:  None. FINDINGS: There is no evidence of fracture or dislocation. There is no evidence of arthropathy or other focal bone abnormality. Soft tissues are unremarkable. IMPRESSION: Normal right shoulder. Electronically Signed   By: Lupita Raider, M.D.  On: 11/21/2017 13:46    Assessment & Plan:   There are no diagnoses linked to this encounter.   No orders of the defined types were placed in this encounter.    Follow-up: No follow-ups on file.  Sonda Primes, MD

## 2018-06-19 NOTE — Assessment & Plan Note (Signed)
Better To work on 06/25/18

## 2018-06-20 NOTE — Telephone Encounter (Signed)
Patient is having her FMLA extend after her OV with Dr.Plotnikov on 11/5. She now has a return to work day on 11/11.   I have sent over the updated information to [employer] via fax.

## 2018-06-21 NOTE — Telephone Encounter (Signed)
Patient is calling to state she spoke with [employer] today 06-21-18 and they stated they have not received the fax for her FMLA. Please advise

## 2018-06-22 NOTE — Telephone Encounter (Signed)
I am faxing this information again to [employer].

## 2018-07-03 DIAGNOSIS — H16143 Punctate keratitis, bilateral: Secondary | ICD-10-CM | POA: Diagnosis not present

## 2018-08-01 MED FILL — CHLORHEXIDINE 0.12% RINSE: 0.12 | 15 days supply | Qty: 473 | Fill #0

## 2018-08-01 MED FILL — AMOXICILLIN 875 MG TABS: 875 | 8 days supply | Qty: 20 | Fill #0

## 2018-10-04 DIAGNOSIS — H16143 Punctate keratitis, bilateral: Secondary | ICD-10-CM | POA: Diagnosis not present

## 2019-01-31 ENCOUNTER — Ambulatory Visit (INDEPENDENT_AMBULATORY_CARE_PROVIDER_SITE_OTHER): Payer: 59 | Admitting: Internal Medicine

## 2019-01-31 ENCOUNTER — Other Ambulatory Visit (INDEPENDENT_AMBULATORY_CARE_PROVIDER_SITE_OTHER): Payer: 59

## 2019-01-31 ENCOUNTER — Other Ambulatory Visit: Payer: Self-pay

## 2019-01-31 ENCOUNTER — Encounter: Payer: Self-pay | Admitting: Internal Medicine

## 2019-01-31 DIAGNOSIS — Z0000 Encounter for general adult medical examination without abnormal findings: Secondary | ICD-10-CM

## 2019-01-31 LAB — CBC WITH DIFFERENTIAL/PLATELET
Basophils Absolute: 0 10*3/uL (ref 0.0–0.1)
Basophils Relative: 0.3 % (ref 0.0–3.0)
Eosinophils Absolute: 0.1 10*3/uL (ref 0.0–0.7)
Eosinophils Relative: 3.2 % (ref 0.0–5.0)
HCT: 35.3 % — ABNORMAL LOW (ref 36.0–46.0)
Hemoglobin: 11.8 g/dL — ABNORMAL LOW (ref 12.0–15.0)
Lymphocytes Relative: 44.3 % (ref 12.0–46.0)
Lymphs Abs: 1.9 10*3/uL (ref 0.7–4.0)
MCHC: 33.4 g/dL (ref 30.0–36.0)
MCV: 89.1 fl (ref 78.0–100.0)
Monocytes Absolute: 0.4 10*3/uL (ref 0.1–1.0)
Monocytes Relative: 10.2 % (ref 3.0–12.0)
Neutro Abs: 1.8 10*3/uL (ref 1.4–7.7)
Neutrophils Relative %: 42 % — ABNORMAL LOW (ref 43.0–77.0)
Platelets: 209 10*3/uL (ref 150.0–400.0)
RBC: 3.97 Mil/uL (ref 3.87–5.11)
RDW: 12.3 % (ref 11.5–15.5)
WBC: 4.2 10*3/uL (ref 4.0–10.5)

## 2019-01-31 LAB — BASIC METABOLIC PANEL
BUN: 13 mg/dL (ref 6–23)
CO2: 24 mEq/L (ref 19–32)
Calcium: 9.9 mg/dL (ref 8.4–10.5)
Chloride: 108 mEq/L (ref 96–112)
Creatinine, Ser: 0.58 mg/dL (ref 0.40–1.20)
GFR: 123.86 mL/min (ref >60.00)
Glucose, Bld: 92 mg/dL (ref 70–99)
Potassium: 3.9 mEq/L (ref 3.5–5.1)
Sodium: 139 mEq/L (ref 135–145)

## 2019-01-31 LAB — LIPID PANEL
Cholesterol: 143 mg/dL (ref 0–200)
HDL: 65.5 mg/dL (ref >39.00)
LDL Cholesterol: 68 mg/dL (ref 0–99)
NonHDL: 77.13
Total CHOL/HDL Ratio: 2
Triglycerides: 48 mg/dL (ref 0.0–149.0)
VLDL: 9.6 mg/dL (ref 0.0–40.0)

## 2019-01-31 LAB — URINALYSIS, ROUTINE W REFLEX MICROSCOPIC
Bilirubin Urine: NEGATIVE
Ketones, ur: NEGATIVE
Nitrite: NEGATIVE
Specific Gravity, Urine: 1.02 (ref 1.000–1.030)
Total Protein, Urine: NEGATIVE
Urine Glucose: NEGATIVE
Urobilinogen, UA: 0.2 (ref 0.0–1.0)
pH: 6.5 (ref 5.0–8.0)

## 2019-01-31 LAB — HEPATIC FUNCTION PANEL
ALT: 21 U/L (ref 0–35)
AST: 17 U/L (ref 0–37)
Albumin: 3.8 g/dL (ref 3.5–5.2)
Alkaline Phosphatase: 78 U/L (ref 39–117)
Bilirubin, Direct: 0.2 mg/dL (ref 0.0–0.3)
Total Bilirubin: 1 mg/dL (ref 0.2–1.2)
Total Protein: 6.6 g/dL (ref 6.0–8.3)

## 2019-01-31 LAB — TSH: TSH: <0.01 u[IU]/mL — ABNORMAL LOW (ref 0.35–4.50)

## 2019-01-31 NOTE — Progress Notes (Signed)
Subjective:  Patient ID: Sarah Sparks, female    DOB: 1946/01/21  Age: 73 y.o. MRN: [id number]  CC: No chief complaint on file.   HPI Sarah Jumbolaine Kevorkian presents for a well exam  Outpatient Medications Prior to Visit  Medication Sig Dispense Refill  . Cholecalciferol 1000 units tablet Take 2 tablets (2,000 Units total) by mouth daily. 100 tablet 3  . meclizine (ANTIVERT) 12.5 MG tablet Take 1 tablet (12.5 mg total) by mouth 3 (three) times daily as needed for dizziness. 60 tablet 1   No facility-administered medications prior to visit.     ROS: Review of Systems  Constitutional: Negative for activity change, appetite change, chills, fatigue and unexpected weight change.  HENT: Negative for congestion, mouth sores and sinus pressure.   Eyes: Negative for visual disturbance.  Respiratory: Negative for cough and chest tightness.   Gastrointestinal: Negative for abdominal pain and nausea.  Genitourinary: Negative for difficulty urinating, frequency and vaginal pain.  Musculoskeletal: Negative for back pain and gait problem.  Skin: Negative for pallor and rash.  Neurological: Negative for dizziness, tremors, weakness, numbness and headaches.  Psychiatric/Behavioral: Negative for confusion, sleep disturbance and suicidal ideas.    Objective:  BP 128/68 (BP Location: Left Arm, Patient Position: Sitting, Cuff Size: Normal)   Pulse 72   Temp 98.1 F (36.7 C) (Oral)   Ht 5\' 6"  (1.676 m)   Wt 138 lb (62.6 kg)   SpO2 97%   BMI 22.27 kg/m   BP Readings from Last 3 Encounters:  01/31/19 128/68  06/19/18 132/66  06/05/18 126/72    Wt Readings from Last 3 Encounters:  01/31/19 138 lb (62.6 kg)  06/19/18 141 lb (64 kg)  06/05/18 142 lb (64.4 kg)    Physical Exam Constitutional:      General: She is not in acute distress.    Appearance: She is well-developed.  HENT:     Head: Normocephalic.     Right Ear: External ear normal.     Left Ear: External ear normal.     Nose: Nose  normal.  Eyes:     General:        Right eye: No discharge.        Left eye: No discharge.     Conjunctiva/sclera: Conjunctivae normal.     Pupils: Pupils are equal, round, and reactive to light.  Neck:     Musculoskeletal: Normal range of motion and neck supple.     Thyroid: No thyromegaly.     Vascular: No JVD.     Trachea: No tracheal deviation.  Cardiovascular:     Rate and Rhythm: Normal rate and regular rhythm.     Heart sounds: Normal heart sounds.  Pulmonary:     Effort: No respiratory distress.     Breath sounds: No stridor. No wheezing.  Abdominal:     General: Bowel sounds are normal. There is no distension.     Palpations: Abdomen is soft. There is no mass.     Tenderness: There is no abdominal tenderness. There is no guarding or rebound.  Musculoskeletal:        General: No tenderness.  Lymphadenopathy:     Cervical: No cervical adenopathy.  Skin:    Findings: No erythema or rash.  Neurological:     Cranial Nerves: No cranial nerve deficit.     Motor: No abnormal muscle tone.     Coordination: Coordination normal.     Deep Tendon Reflexes: Reflexes normal.  Psychiatric:  Behavior: Behavior normal.        Thought Content: Thought content normal.        Judgment: Judgment normal.     Lab Results  Component Value Date   WBC 5.3 01/25/2018   HGB 12.0 01/25/2018   HCT 35.5 (L) 01/25/2018   PLT 242.0 01/25/2018   GLUCOSE 94 01/25/2018   CHOL 161 01/25/2018   TRIG 58.0 01/25/2018   HDL 69.50 01/25/2018   LDLCALC 80 01/25/2018   ALT 15 01/25/2018   AST 15 01/25/2018   NA 140 01/25/2018   K 3.7 01/25/2018   CL 108 01/25/2018   CREATININE 0.77 01/25/2018   BUN 16 01/25/2018   CO2 25 01/25/2018   TSH 0.42 01/25/2018    Dg Shoulder Right  Result Date: 11/21/2017 CLINICAL DATA:  Right shoulder pain after injury 3 days ago. EXAM: RIGHT SHOULDER - 2+ VIEW COMPARISON:  None. FINDINGS: There is no evidence of fracture or dislocation. There is no  evidence of arthropathy or other focal bone abnormality. Soft tissues are unremarkable. IMPRESSION: Normal right shoulder. Electronically Signed   By: Marijo Conception, M.D.   On: 11/21/2017 13:46    Assessment & Plan:   There are no diagnoses linked to this encounter.   No orders of the defined types were placed in this encounter.    Follow-up: No follow-ups on file.  Walker Kehr, MD

## 2019-01-31 NOTE — Assessment & Plan Note (Signed)
  We discussed age appropriate health related issues, including available/recomended screening tests and vaccinations. We discussed a need for adhering to healthy diet and exercise. Labs were ordered to be later reviewed . All questions were answered. CT ca scoring test offered 6/20 Cologuard 2015, 2019 Pt declined mammogram, DEXA, PAP Eye exam Labs [facility]   PAP per GYN Dr Phineas Real - not seeing Cologuard (-) 2015, 2019

## 2019-01-31 NOTE — Patient Instructions (Signed)

## 2019-02-13 ENCOUNTER — Other Ambulatory Visit: Payer: Self-pay

## 2019-02-13 DIAGNOSIS — R7989 Other specified abnormal findings of blood chemistry: Secondary | ICD-10-CM

## 2019-11-11 DIAGNOSIS — Z03818 Encounter for observation for suspected exposure to other biological agents ruled out: Secondary | ICD-10-CM | POA: Diagnosis not present

## 2020-02-03 ENCOUNTER — Other Ambulatory Visit: Payer: Self-pay

## 2020-02-03 ENCOUNTER — Encounter: Payer: Self-pay | Admitting: Internal Medicine

## 2020-02-03 ENCOUNTER — Ambulatory Visit (INDEPENDENT_AMBULATORY_CARE_PROVIDER_SITE_OTHER): Payer: Medicare HMO | Admitting: Internal Medicine

## 2020-02-03 VITALS — BP 130/80 | HR 57 | Temp 98.2°F | Ht 66.0 in | Wt 148.0 lb

## 2020-02-03 DIAGNOSIS — E785 Hyperlipidemia, unspecified: Secondary | ICD-10-CM

## 2020-02-03 DIAGNOSIS — Z0000 Encounter for general adult medical examination without abnormal findings: Secondary | ICD-10-CM | POA: Diagnosis not present

## 2020-02-03 DIAGNOSIS — R7989 Other specified abnormal findings of blood chemistry: Secondary | ICD-10-CM | POA: Diagnosis not present

## 2020-02-03 DIAGNOSIS — Z23 Encounter for immunization: Secondary | ICD-10-CM | POA: Diagnosis not present

## 2020-02-03 DIAGNOSIS — E559 Vitamin D deficiency, unspecified: Secondary | ICD-10-CM

## 2020-02-03 DIAGNOSIS — R3129 Other microscopic hematuria: Secondary | ICD-10-CM

## 2020-02-03 LAB — BASIC METABOLIC PANEL
BUN: 18 mg/dL (ref 6–23)
CO2: 26 mEq/L (ref 19–32)
Calcium: 10.1 mg/dL (ref 8.4–10.5)
Chloride: 107 mEq/L (ref 96–112)
Creatinine, Ser: 0.78 mg/dL (ref 0.40–1.20)
GFR: 87.74 mL/min (ref >60.00)
Glucose, Bld: 80 mg/dL (ref 70–99)
Potassium: 3.9 mEq/L (ref 3.5–5.1)
Sodium: 139 mEq/L (ref 135–145)

## 2020-02-03 LAB — URINALYSIS, ROUTINE W REFLEX MICROSCOPIC
Bilirubin Urine: NEGATIVE
Ketones, ur: NEGATIVE
Nitrite: NEGATIVE
Specific Gravity, Urine: >=1.03 — AB (ref 1.000–1.030)
Total Protein, Urine: NEGATIVE
Urine Glucose: NEGATIVE
Urobilinogen, UA: 0.2 (ref 0.0–1.0)
pH: 5.5 (ref 5.0–8.0)

## 2020-02-03 LAB — HEPATIC FUNCTION PANEL
ALT: 13 U/L (ref 0–35)
AST: 16 U/L (ref 0–37)
Albumin: 4.2 g/dL (ref 3.5–5.2)
Alkaline Phosphatase: 70 U/L (ref 39–117)
Bilirubin, Direct: 0.1 mg/dL (ref 0.0–0.3)
Total Bilirubin: 0.7 mg/dL (ref 0.2–1.2)
Total Protein: 7.1 g/dL (ref 6.0–8.3)

## 2020-02-03 LAB — CBC WITH DIFFERENTIAL/PLATELET
Basophils Absolute: 0 10*3/uL (ref 0.0–0.1)
Basophils Relative: 0.8 % (ref 0.0–3.0)
Eosinophils Absolute: 0.2 10*3/uL (ref 0.0–0.7)
Eosinophils Relative: 5 % (ref 0.0–5.0)
HCT: 36.1 % (ref 36.0–46.0)
Hemoglobin: 12.2 g/dL (ref 12.0–15.0)
Lymphocytes Relative: 54.1 % — ABNORMAL HIGH (ref 12.0–46.0)
Lymphs Abs: 2.2 10*3/uL (ref 0.7–4.0)
MCHC: 33.9 g/dL (ref 30.0–36.0)
MCV: 93.4 fl (ref 78.0–100.0)
Monocytes Absolute: 0.3 10*3/uL (ref 0.1–1.0)
Monocytes Relative: 8.3 % (ref 3.0–12.0)
Neutro Abs: 1.3 10*3/uL — ABNORMAL LOW (ref 1.4–7.7)
Neutrophils Relative %: 31.8 % — ABNORMAL LOW (ref 43.0–77.0)
Platelets: 201 10*3/uL (ref 150.0–400.0)
RBC: 3.86 Mil/uL — ABNORMAL LOW (ref 3.87–5.11)
RDW: 12.9 % (ref 11.5–15.5)
WBC: 4 10*3/uL (ref 4.0–10.5)

## 2020-02-03 LAB — LIPID PANEL
Cholesterol: 203 mg/dL — ABNORMAL HIGH (ref 0–200)
HDL: 87.6 mg/dL (ref >39.00)
LDL Cholesterol: 106 mg/dL — ABNORMAL HIGH (ref 0–99)
NonHDL: 115.2
Total CHOL/HDL Ratio: 2
Triglycerides: 46 mg/dL (ref 0.0–149.0)
VLDL: 9.2 mg/dL (ref 0.0–40.0)

## 2020-02-03 LAB — T4, FREE: Free T4: 0.9 ng/dL (ref 0.60–1.60)

## 2020-02-03 LAB — TSH: TSH: 1.41 u[IU]/mL (ref 0.35–4.50)

## 2020-02-03 LAB — T3, FREE: T3, Free: 3.2 pg/mL (ref 2.3–4.2)

## 2020-02-03 NOTE — Patient Instructions (Signed)

## 2020-02-03 NOTE — Assessment & Plan Note (Signed)
  CT ca scoring test offered 6/20, 6/21 Cologuard 2015, 2019 Pt declined mammogram, DEXA, PAP Eye exam Labs [facility]    PAP per GYN Dr Audie Box - not seeing Cologuard (-) 2015, 2019  Here for medicare wellness/physical  Diet: heart healthy  Physical activity: not sedentary  Depression/mood screen: negative  Hearing: intact to whispered voice  Visual acuity: grossly normal, performs annual eye exam  ADLs: capable  Fall risk: low to none  Home safety: good  Cognitive evaluation: intact to orientation, naming, recall and repetition  EOL planning: adv directives, full code/ I agree  I have personally reviewed and have noted  1. The patient's medical, surgical and social history  2. Their use of alcohol, tobacco or illicit drugs  3. Their current medications and supplements  4. The patient's functional ability including ADL's, fall risks, home safety risks and hearing or visual impairment.  5. Diet and physical activities  6. Evidence for depression or mood disorders 7. The roster of all physicians providing medical care to patient - is listed in the Snapshot section of the chart and reviewed today.    Today patient counseled on age appropriate routine health concerns for screening and prevention, each reviewed and up to date or declined. Immunizations reviewed and up to date or declined. Labs ordered and reviewed. Risk factors for depression reviewed and negative. Hearing function and visual acuity are intact. ADLs screened and addressed as needed. Functional ability and level of safety reviewed and appropriate. Education, counseling and referrals performed based on assessed risks today. Patient provided with a copy of personalized plan for preventive services.

## 2020-02-03 NOTE — Progress Notes (Signed)
Subjective:  Patient ID: Sarah Sparks, female    DOB: 1946-07-27  Age: 74 y.o. MRN: [id number]  CC: No chief complaint on file.   HPI Sarah Sparks presents for a vit D def, [facility] well exam   Outpatient Medications Prior to Visit  Medication Sig Dispense Refill  . Cholecalciferol 1000 units tablet Take 2 tablets (2,000 Units total) by mouth daily. (Patient not taking: Reported on 02/03/2020) 100 tablet 3   No facility-administered medications prior to visit.    ROS: Review of Systems  Constitutional: Negative for activity change, appetite change, chills, fatigue and unexpected weight change.  HENT: Negative for congestion, mouth sores and sinus pressure.   Eyes: Negative for visual disturbance.  Respiratory: Negative for cough and chest tightness.   Gastrointestinal: Negative for abdominal pain and nausea.  Genitourinary: Negative for difficulty urinating, frequency and vaginal pain.  Musculoskeletal: Negative for back pain and gait problem.  Skin: Negative for pallor and rash.  Neurological: Negative for dizziness, tremors, weakness, numbness and headaches.  Psychiatric/Behavioral: Negative for confusion and sleep disturbance.    Objective:  BP 130/80 (BP Location: Right Arm, Patient Position: Sitting, Cuff Size: Normal)   Pulse (!) 57   Temp 98.2 F (36.8 C) (Oral)   Ht 5\' 6"  (1.676 m)   Wt 148 lb (67.1 kg)   SpO2 97%   BMI 23.89 kg/m   BP Readings from Last 3 Encounters:  02/03/20 130/80  01/31/19 128/68  06/19/18 132/66    Wt Readings from Last 3 Encounters:  02/03/20 148 lb (67.1 kg)  01/31/19 138 lb (62.6 kg)  06/19/18 141 lb (64 kg)    Physical Exam Constitutional:      General: She is not in acute distress.    Appearance: She is well-developed.  HENT:     Head: Normocephalic.     Right Ear: External ear normal.     Left Ear: External ear normal.     Nose: Nose normal.  Eyes:     General:        Right eye: No discharge.        Left eye: No  discharge.     Conjunctiva/sclera: Conjunctivae normal.     Pupils: Pupils are equal, round, and reactive to light.  Neck:     Thyroid: No thyromegaly.     Vascular: No JVD.     Trachea: No tracheal deviation.  Cardiovascular:     Rate and Rhythm: Normal rate and regular rhythm.     Heart sounds: Normal heart sounds.  Pulmonary:     Effort: No respiratory distress.     Breath sounds: No stridor. No wheezing.  Abdominal:     General: Bowel sounds are normal. There is no distension.     Palpations: Abdomen is soft. There is no mass.     Tenderness: There is no abdominal tenderness. There is no guarding or rebound.  Musculoskeletal:        General: No tenderness.     Cervical back: Normal range of motion and neck supple.  Lymphadenopathy:     Cervical: No cervical adenopathy.  Skin:    Findings: No erythema or rash.  Neurological:     Cranial Nerves: No cranial nerve deficit.     Motor: No abnormal muscle tone.     Coordination: Coordination normal.     Deep Tendon Reflexes: Reflexes normal.  Psychiatric:        Behavior: Behavior normal.        Thought  Content: Thought content normal.        Judgment: Judgment normal.    Vision test, hearing OK Using glasses to read  Lab Results  Component Value Date   WBC 4.2 01/31/2019   HGB 11.8 (L) 01/31/2019   HCT 35.3 (L) 01/31/2019   PLT 209.0 01/31/2019   GLUCOSE 92 01/31/2019   CHOL 143 01/31/2019   TRIG 48.0 01/31/2019   HDL 65.50 01/31/2019   LDLCALC 68 01/31/2019   ALT 21 01/31/2019   AST 17 01/31/2019   NA 139 01/31/2019   K 3.9 01/31/2019   CL 108 01/31/2019   CREATININE 0.58 01/31/2019   BUN 13 01/31/2019   CO2 24 01/31/2019   TSH <0.01 (L) 01/31/2019    DG Shoulder Right  Result Date: 11/21/2017 CLINICAL DATA:  Right shoulder pain after injury 3 days ago. EXAM: RIGHT SHOULDER - 2+ VIEW COMPARISON:  None. FINDINGS: There is no evidence of fracture or dislocation. There is no evidence of arthropathy or other  focal bone abnormality. Soft tissues are unremarkable. IMPRESSION: Normal right shoulder. Electronically Signed   By: Lupita Raider, M.D.   On: 11/21/2017 13:46    Assessment & Plan:    Follow-up: No follow-ups on file.  Sonda Primes, MD

## 2020-02-03 NOTE — Addendum Note (Signed)
Addended by: Nena Alexander C on: 02/03/2020 11:05 AM   Modules accepted: Orders

## 2020-02-03 NOTE — Assessment & Plan Note (Signed)
On vit D 

## 2020-02-03 NOTE — Assessment & Plan Note (Signed)
Labs

## 2020-02-03 NOTE — Assessment & Plan Note (Signed)
UA

## 2020-06-30 DIAGNOSIS — K045 Chronic apical periodontitis: Secondary | ICD-10-CM | POA: Diagnosis not present

## 2020-08-01 DIAGNOSIS — Z20822 Contact with and (suspected) exposure to covid-19: Secondary | ICD-10-CM | POA: Diagnosis not present

## 2021-02-04 ENCOUNTER — Ambulatory Visit (INDEPENDENT_AMBULATORY_CARE_PROVIDER_SITE_OTHER): Payer: Medicare HMO | Admitting: Internal Medicine

## 2021-02-04 ENCOUNTER — Encounter: Payer: Self-pay | Admitting: Internal Medicine

## 2021-02-04 ENCOUNTER — Other Ambulatory Visit: Payer: Self-pay

## 2021-02-04 VITALS — BP 128/72 | HR 58 | Temp 98.6°F | Ht 66.0 in | Wt 153.2 lb

## 2021-02-04 DIAGNOSIS — R42 Dizziness and giddiness: Secondary | ICD-10-CM

## 2021-02-04 DIAGNOSIS — Q178 Other specified congenital malformations of ear: Secondary | ICD-10-CM | POA: Diagnosis not present

## 2021-02-04 DIAGNOSIS — Z0000 Encounter for general adult medical examination without abnormal findings: Secondary | ICD-10-CM | POA: Diagnosis not present

## 2021-02-04 DIAGNOSIS — E559 Vitamin D deficiency, unspecified: Secondary | ICD-10-CM | POA: Diagnosis not present

## 2021-02-04 LAB — URINALYSIS, ROUTINE W REFLEX MICROSCOPIC
Bilirubin Urine: NEGATIVE
Ketones, ur: NEGATIVE
Nitrite: NEGATIVE
Specific Gravity, Urine: 1.02 (ref 1.000–1.030)
Total Protein, Urine: NEGATIVE
Urine Glucose: NEGATIVE
Urobilinogen, UA: 0.2 (ref 0.0–1.0)
pH: 6.5 (ref 5.0–8.0)

## 2021-02-04 LAB — CBC WITH DIFFERENTIAL/PLATELET
Basophils Absolute: 0 10*3/uL (ref 0.0–0.1)
Basophils Relative: 0.6 % (ref 0.0–3.0)
Eosinophils Absolute: 0.1 10*3/uL (ref 0.0–0.7)
Eosinophils Relative: 2.9 % (ref 0.0–5.0)
HCT: 36.3 % (ref 36.0–46.0)
Hemoglobin: 12.4 g/dL (ref 12.0–15.0)
Lymphocytes Relative: 46.3 % — ABNORMAL HIGH (ref 12.0–46.0)
Lymphs Abs: 1.9 10*3/uL (ref 0.7–4.0)
MCHC: 34.1 g/dL (ref 30.0–36.0)
MCV: 92 fl (ref 78.0–100.0)
Monocytes Absolute: 0.3 10*3/uL (ref 0.1–1.0)
Monocytes Relative: 8.5 % (ref 3.0–12.0)
Neutro Abs: 1.7 10*3/uL (ref 1.4–7.7)
Neutrophils Relative %: 41.7 % — ABNORMAL LOW (ref 43.0–77.0)
Platelets: 207 10*3/uL (ref 150.0–400.0)
RBC: 3.94 Mil/uL (ref 3.87–5.11)
RDW: 12.6 % (ref 11.5–15.5)
WBC: 4.1 10*3/uL (ref 4.0–10.5)

## 2021-02-04 LAB — COMPREHENSIVE METABOLIC PANEL
ALT: 14 U/L (ref 0–35)
AST: 16 U/L (ref 0–37)
Albumin: 4.1 g/dL (ref 3.5–5.2)
Alkaline Phosphatase: 63 U/L (ref 39–117)
BUN: 21 mg/dL (ref 6–23)
CO2: 26 mEq/L (ref 19–32)
Calcium: 9.6 mg/dL (ref 8.4–10.5)
Chloride: 107 mEq/L (ref 96–112)
Creatinine, Ser: 0.81 mg/dL (ref 0.40–1.20)
GFR: 72.01 mL/min (ref >60.00)
Glucose, Bld: 95 mg/dL (ref 70–99)
Potassium: 3.8 mEq/L (ref 3.5–5.1)
Sodium: 140 mEq/L (ref 135–145)
Total Bilirubin: 1.1 mg/dL (ref 0.2–1.2)
Total Protein: 7.1 g/dL (ref 6.0–8.3)

## 2021-02-04 LAB — TSH: TSH: 1.54 u[IU]/mL (ref 0.35–4.50)

## 2021-02-04 LAB — LIPID PANEL
Cholesterol: 207 mg/dL — ABNORMAL HIGH (ref 0–200)
HDL: 92.1 mg/dL (ref >39.00)
LDL Cholesterol: 105 mg/dL — ABNORMAL HIGH (ref 0–99)
NonHDL: 114.61
Total CHOL/HDL Ratio: 2
Triglycerides: 47 mg/dL (ref 0.0–149.0)
VLDL: 9.4 mg/dL (ref 0.0–40.0)

## 2021-02-04 NOTE — Assessment & Plan Note (Signed)
No relapse CBC 

## 2021-02-04 NOTE — Assessment & Plan Note (Addendum)
  We discussed age appropriate health related issues, including available/recomended screening tests and vaccinations. Labs were ordered to be later reviewed . All questions were answered. We discussed one or more of the following - seat belt use, use of sunscreen/sun exposure exercise, fall risk reduction, second hand smoke exposure, firearm use and storage, seat belt use, a need for adhering to healthy diet and exercise. Labs were ordered.  All questions were answered. Cor calcium CT score test was offered

## 2021-02-04 NOTE — Assessment & Plan Note (Signed)
On Vit D 

## 2021-02-04 NOTE — Assessment & Plan Note (Signed)
L>R  ENT ref

## 2021-02-04 NOTE — Patient Instructions (Signed)

## 2021-02-04 NOTE — Progress Notes (Signed)
Subjective:  Patient ID: Sarah Sparks, female    DOB: 09-Feb-1946  Age: 75 y.o. MRN: [id number]  CC: Annual Exam   HPI Sarah Sparks presents for a well exam  Outpatient Medications Prior to Visit  Medication Sig Dispense Refill   Cholecalciferol 1000 units tablet Take 2 tablets (2,000 Units total) by mouth daily. (Patient not taking: No sig reported) 100 tablet 3   No facility-administered medications prior to visit.    ROS: Review of Systems  Constitutional:  Negative for activity change, appetite change, chills, fatigue and unexpected weight change.  HENT:  Negative for congestion, mouth sores and sinus pressure.   Eyes:  Negative for visual disturbance.  Respiratory:  Negative for cough and chest tightness.   Gastrointestinal:  Negative for abdominal pain and nausea.  Genitourinary:  Negative for difficulty urinating, frequency and vaginal pain.  Musculoskeletal:  Negative for back pain and gait problem.  Skin:  Negative for pallor and rash.  Neurological:  Negative for dizziness, tremors, weakness, numbness and headaches.  Psychiatric/Behavioral:  Negative for confusion and sleep disturbance.    Objective:  BP 128/72 (BP Location: Left Arm)   Pulse (!) 58   Temp 98.6 F (37 C) (Oral)   Ht 5\' 6"  (1.676 m)   Wt 153 lb 3.2 oz (69.5 kg)   SpO2 97%   BMI 24.73 kg/m   BP Readings from Last 3 Encounters:  02/04/21 128/72  02/03/20 130/80  01/31/19 128/68    Wt Readings from Last 3 Encounters:  02/04/21 153 lb 3.2 oz (69.5 kg)  02/03/20 148 lb (67.1 kg)  01/31/19 138 lb (62.6 kg)    Physical Exam Constitutional:      General: She is not in acute distress.    Appearance: She is well-developed.  HENT:     Head: Normocephalic.     Right Ear: External ear normal.     Left Ear: External ear normal.     Nose: Nose normal.  Eyes:     General:        Right eye: No discharge.        Left eye: No discharge.     Conjunctiva/sclera: Conjunctivae normal.      Pupils: Pupils are equal, round, and reactive to light.  Neck:     Thyroid: No thyromegaly.     Vascular: No JVD.     Trachea: No tracheal deviation.  Cardiovascular:     Rate and Rhythm: Normal rate and regular rhythm.     Heart sounds: Normal heart sounds.  Pulmonary:     Effort: No respiratory distress.     Breath sounds: No stridor. No wheezing.  Abdominal:     General: Bowel sounds are normal. There is no distension.     Palpations: Abdomen is soft. There is no mass.     Tenderness: There is no abdominal tenderness. There is no guarding or rebound.  Musculoskeletal:        General: No tenderness.     Cervical back: Normal range of motion and neck supple.  Lymphadenopathy:     Cervical: No cervical adenopathy.  Skin:    Findings: No erythema or rash.  Neurological:     Cranial Nerves: No cranial nerve deficit.     Motor: No abnormal muscle tone.     Coordination: Coordination normal.     Deep Tendon Reflexes: Reflexes normal.  Psychiatric:        Behavior: Behavior normal.        Thought  Content: Thought content normal.        Judgment: Judgment normal.   Split earlobes B Lab Results  Component Value Date   WBC 4.0 02/03/2020   HGB 12.2 02/03/2020   HCT 36.1 02/03/2020   PLT 201.0 02/03/2020   GLUCOSE 80 02/03/2020   CHOL 203 (H) 02/03/2020   TRIG 46.0 02/03/2020   HDL 87.60 02/03/2020   LDLCALC 106 (H) 02/03/2020   ALT 13 02/03/2020   AST 16 02/03/2020   NA 139 02/03/2020   K 3.9 02/03/2020   CL 107 02/03/2020   CREATININE 0.78 02/03/2020   BUN 18 02/03/2020   CO2 26 02/03/2020   TSH 1.41 02/03/2020    DG Shoulder Right  Result Date: 11/21/2017 CLINICAL DATA:  Right shoulder pain after injury 3 days ago. EXAM: RIGHT SHOULDER - 2+ VIEW COMPARISON:  None. FINDINGS: There is no evidence of fracture or dislocation. There is no evidence of arthropathy or other focal bone abnormality. Soft tissues are unremarkable. IMPRESSION: Normal right shoulder.  Electronically Signed   By: Lupita Raider, M.D.   On: 11/21/2017 13:46    Assessment & Plan:     Sonda Primes, MD

## 2021-05-03 ENCOUNTER — Telehealth: Payer: Self-pay | Admitting: Internal Medicine

## 2021-05-03 NOTE — Telephone Encounter (Signed)
Left message for patient to call me back at [phone] to schedule Medicare Annual Wellness Visit   Last AWV  02/03/20  Please schedule at anytime with [facility] Ad[facility] if patient calls the office back.    40 Minutes appointment   Any questions, please call me at [phone]  

## 2021-08-25 ENCOUNTER — Ambulatory Visit (INDEPENDENT_AMBULATORY_CARE_PROVIDER_SITE_OTHER): Payer: Medicare Other | Admitting: Internal Medicine

## 2021-08-25 ENCOUNTER — Encounter: Payer: Self-pay | Admitting: Internal Medicine

## 2021-08-25 ENCOUNTER — Other Ambulatory Visit: Payer: Self-pay

## 2021-08-25 VITALS — BP 122/78 | HR 66 | Temp 98.0°F | Ht 66.0 in | Wt 150.2 lb

## 2021-08-25 DIAGNOSIS — Z1211 Encounter for screening for malignant neoplasm of colon: Secondary | ICD-10-CM | POA: Diagnosis not present

## 2021-08-25 DIAGNOSIS — Z23 Encounter for immunization: Secondary | ICD-10-CM | POA: Diagnosis not present

## 2021-08-25 DIAGNOSIS — F4321 Adjustment disorder with depressed mood: Secondary | ICD-10-CM | POA: Diagnosis not present

## 2021-08-25 DIAGNOSIS — Z021 Encounter for pre-employment examination: Secondary | ICD-10-CM | POA: Diagnosis not present

## 2021-08-25 DIAGNOSIS — Z0000 Encounter for general adult medical examination without abnormal findings: Secondary | ICD-10-CM

## 2021-08-25 NOTE — Progress Notes (Signed)
Subjective:  Patient ID: Sarah Sparks, female    DOB: 1946-05-05  Age: 76 y.o. MRN: [id number]  CC: Follow-up (Pt need medical exemption form filled out)   HPI Tierny Labranche presents for a pre-employment exam, flu shot Tahtiana's husband died in 06/06/2021. Coping ok.  No outpatient medications prior to visit.   No facility-administered medications prior to visit.    ROS: Review of Systems  Constitutional:  Negative for activity change, appetite change, chills, fatigue and unexpected weight change.  HENT:  Negative for congestion, mouth sores and sinus pressure.   Eyes:  Negative for visual disturbance.  Respiratory:  Negative for cough and chest tightness.   Gastrointestinal:  Negative for abdominal pain and nausea.  Genitourinary:  Negative for difficulty urinating, frequency and vaginal pain.  Musculoskeletal:  Negative for back pain and gait problem.  Skin:  Negative for pallor and rash.  Neurological:  Negative for dizziness, tremors, weakness, numbness and headaches.  Psychiatric/Behavioral:  Negative for confusion, sleep disturbance and suicidal ideas.    Objective:  BP 122/78 (BP Location: Left Arm)    Pulse 66    Temp 98 F (36.7 C) (Oral)    Ht 5\' 6"  (1.676 m)    Wt 150 lb 3.2 oz (68.1 kg)    SpO2 97%    BMI 24.24 kg/m   BP Readings from Last 3 Encounters:  08/25/21 122/78  02/04/21 128/72  02/03/20 130/80    Wt Readings from Last 3 Encounters:  08/25/21 150 lb 3.2 oz (68.1 kg)  02/04/21 153 lb 3.2 oz (69.5 kg)  02/03/20 148 lb (67.1 kg)    Physical Exam Constitutional:      General: She is not in acute distress.    Appearance: She is well-developed.  HENT:     Head: Normocephalic.     Right Ear: External ear normal.     Left Ear: External ear normal.     Nose: Nose normal.  Eyes:     General:        Right eye: No discharge.        Left eye: No discharge.     Conjunctiva/sclera: Conjunctivae normal.     Pupils: Pupils are equal, round, and reactive  to light.  Neck:     Thyroid: No thyromegaly.     Vascular: No JVD.     Trachea: No tracheal deviation.  Cardiovascular:     Rate and Rhythm: Normal rate and regular rhythm.     Heart sounds: Normal heart sounds.  Pulmonary:     Effort: No respiratory distress.     Breath sounds: No stridor. No wheezing.  Abdominal:     General: Bowel sounds are normal. There is no distension.     Palpations: Abdomen is soft. There is no mass.     Tenderness: There is no abdominal tenderness. There is no guarding or rebound.  Musculoskeletal:        General: No tenderness.     Cervical back: Normal range of motion and neck supple. No rigidity.  Lymphadenopathy:     Cervical: No cervical adenopathy.  Skin:    Findings: No erythema or rash.  Neurological:     Cranial Nerves: No cranial nerve deficit.     Motor: No abnormal muscle tone.     Coordination: Coordination normal.     Deep Tendon Reflexes: Reflexes normal.  Psychiatric:        Behavior: Behavior normal.        Thought Content:  Thought content normal.        Judgment: Judgment normal.    Lab Results  Component Value Date   WBC 4.1 02/04/2021   HGB 12.4 02/04/2021   HCT 36.3 02/04/2021   PLT 207.0 02/04/2021   GLUCOSE 95 02/04/2021   CHOL 207 (H) 02/04/2021   TRIG 47.0 02/04/2021   HDL 92.10 02/04/2021   LDLCALC 105 (H) 02/04/2021   ALT 14 02/04/2021   AST 16 02/04/2021   NA 140 02/04/2021   K 3.8 02/04/2021   CL 107 02/04/2021   CREATININE 0.81 02/04/2021   BUN 21 02/04/2021   CO2 26 02/04/2021   TSH 1.54 02/04/2021    DG Shoulder Right  Result Date: 11/21/2017 CLINICAL DATA:  Right shoulder pain after injury 3 days ago. EXAM: RIGHT SHOULDER - 2+ VIEW COMPARISON:  None. FINDINGS: There is no evidence of fracture or dislocation. There is no evidence of arthropathy or other focal bone abnormality. Soft tissues are unremarkable. IMPRESSION: Normal right shoulder. Electronically Signed   By: Marijo Conception, M.D.   On:  11/21/2017 13:46    Assessment & Plan:   Problem List Items Addressed This Visit     Pre-employment examination - Primary    Flu shot Form filled out      Well adult exam    Form filled out Flu shot      Other Visit Diagnoses     Colon cancer screening       Relevant Orders   Cologuard   Needs flu shot              No orders of the defined types were placed in this encounter.     Follow-up: Return in about 6 months (around 02/22/2022) for Wellness Exam.  Walker Kehr, MD

## 2021-08-25 NOTE — Assessment & Plan Note (Signed)
Form filled out Flu shot

## 2021-08-25 NOTE — Assessment & Plan Note (Signed)
Flu shot  Form filled out

## 2021-08-25 NOTE — Assessment & Plan Note (Signed)
Sarah Sparks's husband died in Oct 2022. Coping ok.

## 2021-09-06 ENCOUNTER — Ambulatory Visit: Payer: Medicare HMO | Admitting: Internal Medicine

## 2021-09-28 DIAGNOSIS — H02884 Meibomian gland dysfunction left upper eyelid: Secondary | ICD-10-CM | POA: Diagnosis not present

## 2021-10-13 ENCOUNTER — Ambulatory Visit: Payer: 59 | Admitting: Internal Medicine

## 2021-10-14 ENCOUNTER — Telehealth: Payer: Self-pay

## 2021-10-14 NOTE — Telephone Encounter (Signed)
Pt is stating that the form that was filled out why she couldn't take the COVID test needs to have more detail.  ? ?If you need more info please call pt [phone] ?

## 2021-10-14 NOTE — Telephone Encounter (Signed)
Pt requesting a c/b regarding completion of form noted below  ? ?Pt has an upcoming appt on 3-13 regarding the form ? ?Pt can be reached before 2pm ?

## 2021-10-18 NOTE — Telephone Encounter (Signed)
Pt is requesting a CB in regards to forms being filled out. ? ?Pt [phone] ? ?

## 2021-10-19 NOTE — Telephone Encounter (Signed)
Called the pt and LVM asking the pt to please call the clinic back with the details needed to add to her exemption form. I am needing to know what are her sxs or side effects when she takes that vacc...  ?

## 2021-10-25 ENCOUNTER — Ambulatory Visit: Payer: 59 | Admitting: Internal Medicine

## 2021-10-28 DIAGNOSIS — H2513 Age-related nuclear cataract, bilateral: Secondary | ICD-10-CM | POA: Diagnosis not present

## 2022-02-07 ENCOUNTER — Encounter: Payer: Self-pay | Admitting: Internal Medicine

## 2022-02-07 ENCOUNTER — Ambulatory Visit (INDEPENDENT_AMBULATORY_CARE_PROVIDER_SITE_OTHER): Payer: 59 | Admitting: Internal Medicine

## 2022-02-07 VITALS — BP 130/70 | HR 60 | Temp 98.3°F | Ht 66.0 in | Wt 154.0 lb

## 2022-02-07 DIAGNOSIS — Z1211 Encounter for screening for malignant neoplasm of colon: Secondary | ICD-10-CM | POA: Diagnosis not present

## 2022-02-07 DIAGNOSIS — Z0000 Encounter for general adult medical examination without abnormal findings: Secondary | ICD-10-CM

## 2022-02-07 DIAGNOSIS — E559 Vitamin D deficiency, unspecified: Secondary | ICD-10-CM | POA: Diagnosis not present

## 2022-02-07 DIAGNOSIS — F4321 Adjustment disorder with depressed mood: Secondary | ICD-10-CM

## 2022-02-07 DIAGNOSIS — Z136 Encounter for screening for cardiovascular disorders: Secondary | ICD-10-CM

## 2022-02-07 LAB — URINALYSIS, ROUTINE W REFLEX MICROSCOPIC
Bilirubin Urine: NEGATIVE
Ketones, ur: NEGATIVE
Nitrite: NEGATIVE
Specific Gravity, Urine: 1.015 (ref 1.000–1.030)
Total Protein, Urine: NEGATIVE
Urine Glucose: NEGATIVE
Urobilinogen, UA: 0.2 (ref 0.0–1.0)
pH: 7 (ref 5.0–8.0)

## 2022-02-07 LAB — COMPREHENSIVE METABOLIC PANEL
ALT: 15 U/L (ref 0–35)
AST: 18 U/L (ref 0–37)
Albumin: 3.9 g/dL (ref 3.5–5.2)
Alkaline Phosphatase: 67 U/L (ref 39–117)
BUN: 14 mg/dL (ref 6–23)
CO2: 26 mEq/L (ref 19–32)
Calcium: 9.6 mg/dL (ref 8.4–10.5)
Chloride: 107 mEq/L (ref 96–112)
Creatinine, Ser: 0.82 mg/dL (ref 0.40–1.20)
GFR: 70.46 mL/min (ref >60.00)
Glucose, Bld: 100 mg/dL — ABNORMAL HIGH (ref 70–99)
Potassium: 4 mEq/L (ref 3.5–5.1)
Sodium: 140 mEq/L (ref 135–145)
Total Bilirubin: 0.7 mg/dL (ref 0.2–1.2)
Total Protein: 6.5 g/dL (ref 6.0–8.3)

## 2022-02-07 LAB — LIPID PANEL
Cholesterol: 193 mg/dL (ref 0–200)
HDL: 86.2 mg/dL (ref >39.00)
LDL Cholesterol: 98 mg/dL (ref 0–99)
NonHDL: 107.26
Total CHOL/HDL Ratio: 2
Triglycerides: 45 mg/dL (ref 0.0–149.0)
VLDL: 9 mg/dL (ref 0.0–40.0)

## 2022-02-07 LAB — CBC WITH DIFFERENTIAL/PLATELET
Basophils Absolute: 0 10*3/uL (ref 0.0–0.1)
Basophils Relative: 0.6 % (ref 0.0–3.0)
Eosinophils Absolute: 0.1 10*3/uL (ref 0.0–0.7)
Eosinophils Relative: 3.3 % (ref 0.0–5.0)
HCT: 34.5 % — ABNORMAL LOW (ref 36.0–46.0)
Hemoglobin: 11.7 g/dL — ABNORMAL LOW (ref 12.0–15.0)
Lymphocytes Relative: 46.6 % — ABNORMAL HIGH (ref 12.0–46.0)
Lymphs Abs: 1.8 10*3/uL (ref 0.7–4.0)
MCHC: 33.9 g/dL (ref 30.0–36.0)
MCV: 92.8 fl (ref 78.0–100.0)
Monocytes Absolute: 0.3 10*3/uL (ref 0.1–1.0)
Monocytes Relative: 8.6 % (ref 3.0–12.0)
Neutro Abs: 1.6 10*3/uL (ref 1.4–7.7)
Neutrophils Relative %: 40.9 % — ABNORMAL LOW (ref 43.0–77.0)
Platelets: 207 10*3/uL (ref 150.0–400.0)
RBC: 3.72 Mil/uL — ABNORMAL LOW (ref 3.87–5.11)
RDW: 13.1 % (ref 11.5–15.5)
WBC: 3.9 10*3/uL — ABNORMAL LOW (ref 4.0–10.5)

## 2022-02-07 LAB — TSH: TSH: 1.69 u[IU]/mL (ref 0.35–5.50)

## 2022-02-07 MED ORDER — VITAMIN D3 50 MCG (2000 UT) PO CAPS: 2000.0000 [IU] | ORAL_CAPSULE | Freq: Every day | ORAL | 3 refills | Status: AC

## 2022-02-07 NOTE — Assessment & Plan Note (Addendum)
  We discussed age appropriate health related issues, including available/recomended screening tests and vaccinations. Labs were ordered to be later reviewed . All questions were answered. We discussed one or more of the following - seat belt use, use of sunscreen/sun exposure exercise, fall risk reduction, second hand smoke exposure, firearm use and storage, seat belt use, a need for adhering to healthy diet and exercise. Labs were ordered.  All questions were answered. Coronary calcium CT score test was offered - pt declined

## 2022-02-22 ENCOUNTER — Telehealth: Payer: Self-pay | Admitting: Internal Medicine

## 2022-02-22 NOTE — Telephone Encounter (Signed)
Noted../lmb 

## 2022-02-22 NOTE — Telephone Encounter (Signed)
Pt returned call to get lab results. Advised pt "that all labs are stable.  Sarah Sparks has slightly borderline elevated glucose and a slight stable anemia.  No change in plans."   [facility]

## 2022-02-23 ENCOUNTER — Ambulatory Visit: Payer: 59 | Admitting: Internal Medicine

## 2022-06-13 ENCOUNTER — Telehealth: Payer: Self-pay | Admitting: Internal Medicine

## 2022-06-13 NOTE — Telephone Encounter (Signed)
Patient had a flu shot in January of 2023 - patient wants to know if she is due for another one - please advise.

## 2022-06-14 NOTE — Telephone Encounter (Signed)
Called pt no answer LMOM (C) yes she is due for her "105" flu shot../l,mb

## 2022-06-17 ENCOUNTER — Ambulatory Visit (INDEPENDENT_AMBULATORY_CARE_PROVIDER_SITE_OTHER): Payer: Medicare Other | Admitting: *Deleted

## 2022-06-17 DIAGNOSIS — Z23 Encounter for immunization: Secondary | ICD-10-CM

## 2022-06-17 NOTE — Progress Notes (Signed)
Administered regular flu shot left deltoid. Pt tolerated well. 

## 2022-09-22 ENCOUNTER — Telehealth: Payer: Self-pay

## 2022-09-22 NOTE — Telephone Encounter (Signed)
Left message for patient to call back to schedule Medicare Annual Wellness Visit   Last AWV  02/03/20  Please schedule at anytime with [facility] if patient calls the office back.    30 Minutes appointment   Any questions, please call me at [phone]

## 2022-10-14 NOTE — Telephone Encounter (Signed)
Patient had telephone AWV scheduled for 10/13/22 at 3:30. She said she did not receive a call for the appointment. She would like a callback to get rescheduled at [phone].

## 2022-10-19 NOTE — Progress Notes (Signed)
Contacted Sarah Sparks to schedule their annual wellness visit. Appointment made for 10/27/22.  Norton Blizzard, [place] ([facility])  [place] [facility] [phone]

## 2022-10-27 ENCOUNTER — Telehealth: Payer: Self-pay

## 2022-10-27 NOTE — Telephone Encounter (Signed)
This nurse attempted to call patient for AWV. Her sister answered the phone and stated that patient would be back in a few minutes. I left my number for her to call back.

## 2023-02-09 ENCOUNTER — Encounter: Payer: Medicare Other | Admitting: Internal Medicine

## 2023-02-20 DIAGNOSIS — H2513 Age-related nuclear cataract, bilateral: Secondary | ICD-10-CM | POA: Diagnosis not present

## 2023-03-02 ENCOUNTER — Ambulatory Visit (INDEPENDENT_AMBULATORY_CARE_PROVIDER_SITE_OTHER): Payer: Medicare Other | Admitting: Internal Medicine

## 2023-03-02 ENCOUNTER — Encounter: Payer: Self-pay | Admitting: Internal Medicine

## 2023-03-02 VITALS — BP 118/70 | HR 101 | Temp 98.6°F | Ht 66.0 in | Wt 154.0 lb

## 2023-03-02 DIAGNOSIS — E559 Vitamin D deficiency, unspecified: Secondary | ICD-10-CM

## 2023-03-02 DIAGNOSIS — Z0000 Encounter for general adult medical examination without abnormal findings: Secondary | ICD-10-CM

## 2023-03-02 DIAGNOSIS — Z1211 Encounter for screening for malignant neoplasm of colon: Secondary | ICD-10-CM | POA: Diagnosis not present

## 2023-03-02 DIAGNOSIS — Z23 Encounter for immunization: Secondary | ICD-10-CM | POA: Diagnosis not present

## 2023-03-02 LAB — URINALYSIS, ROUTINE W REFLEX MICROSCOPIC
Bilirubin Urine: NEGATIVE
Ketones, ur: NEGATIVE
Nitrite: NEGATIVE
Specific Gravity, Urine: 1.025 (ref 1.000–1.030)
Total Protein, Urine: NEGATIVE
Urine Glucose: NEGATIVE
Urobilinogen, UA: 0.2 (ref 0.0–1.0)
pH: 6 (ref 5.0–8.0)

## 2023-03-02 LAB — CBC WITH DIFFERENTIAL/PLATELET
Basophils Absolute: 0 10*3/uL (ref 0.0–0.1)
Basophils Relative: 1.1 % (ref 0.0–3.0)
Eosinophils Absolute: 0.2 10*3/uL (ref 0.0–0.7)
Eosinophils Relative: 3.5 % (ref 0.0–5.0)
HCT: 35.1 % — ABNORMAL LOW (ref 36.0–46.0)
Hemoglobin: 11.9 g/dL — ABNORMAL LOW (ref 12.0–15.0)
Lymphocytes Relative: 45.4 % (ref 12.0–46.0)
Lymphs Abs: 2 10*3/uL (ref 0.7–4.0)
MCHC: 33.9 g/dL (ref 30.0–36.0)
MCV: 93.6 fl (ref 78.0–100.0)
Monocytes Absolute: 0.4 10*3/uL (ref 0.1–1.0)
Monocytes Relative: 8.5 % (ref 3.0–12.0)
Neutro Abs: 1.8 10*3/uL (ref 1.4–7.7)
Neutrophils Relative %: 41.5 % — ABNORMAL LOW (ref 43.0–77.0)
Platelets: 198 10*3/uL (ref 150.0–400.0)
RBC: 3.75 Mil/uL — ABNORMAL LOW (ref 3.87–5.11)
RDW: 12.5 % (ref 11.5–15.5)
WBC: 4.3 10*3/uL (ref 4.0–10.5)

## 2023-03-02 LAB — COMPREHENSIVE METABOLIC PANEL
ALT: 14 U/L (ref 0–35)
AST: 16 U/L (ref 0–37)
Albumin: 3.9 g/dL (ref 3.5–5.2)
Alkaline Phosphatase: 68 U/L (ref 39–117)
BUN: 15 mg/dL (ref 6–23)
CO2: 28 mEq/L (ref 19–32)
Calcium: 9.5 mg/dL (ref 8.4–10.5)
Chloride: 108 mEq/L (ref 96–112)
Creatinine, Ser: 0.8 mg/dL (ref 0.40–1.20)
GFR: 72.04 mL/min (ref >60.00)
Glucose, Bld: 98 mg/dL (ref 70–99)
Potassium: 4 mEq/L (ref 3.5–5.1)
Sodium: 141 mEq/L (ref 135–145)
Total Bilirubin: 0.7 mg/dL (ref 0.2–1.2)
Total Protein: 6.6 g/dL (ref 6.0–8.3)

## 2023-03-02 LAB — LIPID PANEL
Cholesterol: 199 mg/dL (ref 0–200)
HDL: 83.6 mg/dL (ref >39.00)
LDL Cholesterol: 104 mg/dL — ABNORMAL HIGH (ref 0–99)
NonHDL: 115.62
Total CHOL/HDL Ratio: 2
Triglycerides: 58 mg/dL (ref 0.0–149.0)
VLDL: 11.6 mg/dL (ref 0.0–40.0)

## 2023-03-02 LAB — TSH: TSH: 1.84 u[IU]/mL (ref 0.35–5.50)

## 2023-03-02 NOTE — Progress Notes (Signed)
Subjective:  Patient ID: Sarah Sparks, female    DOB: 09/14/1945  Age: 77 y.o. MRN: [id number]  CC: Annual Exam   HPI Sarah Sparks presents for well exam Working part time  Outpatient Medications Prior to Visit  Medication Sig Dispense Refill   Cholecalciferol (VITAMIN D3) 50 MCG (2000 UT) capsule Take 1 capsule (2,000 Units total) by mouth daily. 100 capsule 3   No facility-administered medications prior to visit.    ROS: Review of Systems  Constitutional:  Negative for activity change, appetite change, chills, fatigue and unexpected weight change.  HENT:  Negative for congestion, mouth sores and sinus pressure.   Eyes:  Negative for visual disturbance.  Respiratory:  Negative for cough and chest tightness.   Gastrointestinal:  Negative for abdominal pain and nausea.  Genitourinary:  Negative for difficulty urinating, frequency and vaginal pain.  Musculoskeletal:  Negative for back pain and gait problem.  Skin:  Negative for pallor and rash.  Neurological:  Negative for dizziness, tremors, weakness, numbness and headaches.  Psychiatric/Behavioral:  Negative for confusion and sleep disturbance.     Objective:  BP 118/70 (BP Location: Left Arm, Patient Position: Sitting, Cuff Size: Large)   Pulse (!) 101   Temp 98.6 F (37 C) (Oral)   Ht 5\' 6"  (1.676 m)   Wt 154 lb (69.9 kg)   SpO2 96%   BMI 24.86 kg/m   BP Readings from Last 3 Encounters:  03/02/23 118/70  02/07/22 130/70  08/25/21 122/78    Wt Readings from Last 3 Encounters:  03/02/23 154 lb (69.9 kg)  02/07/22 154 lb (69.9 kg)  08/25/21 150 lb 3.2 oz (68.1 kg)    Physical Exam Constitutional:      General: She is not in acute distress.    Appearance: She is well-developed.  HENT:     Head: Normocephalic.     Right Ear: External ear normal.     Left Ear: External ear normal.     Nose: Nose normal.  Eyes:     General:        Right eye: No discharge.        Left eye: No discharge.      Conjunctiva/sclera: Conjunctivae normal.     Pupils: Pupils are equal, round, and reactive to light.  Neck:     Thyroid: No thyromegaly.     Vascular: No JVD.     Trachea: No tracheal deviation.  Cardiovascular:     Rate and Rhythm: Normal rate and regular rhythm.     Heart sounds: Normal heart sounds.  Pulmonary:     Effort: No respiratory distress.     Breath sounds: No stridor. No wheezing.  Abdominal:     General: Bowel sounds are normal. There is no distension.     Palpations: Abdomen is soft. There is no mass.     Tenderness: There is no abdominal tenderness. There is no guarding or rebound.  Musculoskeletal:        General: No tenderness.     Cervical back: Normal range of motion and neck supple. No rigidity.  Lymphadenopathy:     Cervical: No cervical adenopathy.  Skin:    Findings: No erythema or rash.  Neurological:     Cranial Nerves: No cranial nerve deficit.     Motor: No abnormal muscle tone.     Coordination: Coordination normal.     Deep Tendon Reflexes: Reflexes normal.  Psychiatric:        Behavior: Behavior normal.  Thought Content: Thought content normal.        Judgment: Judgment normal.     Lab Results  Component Value Date   WBC 3.9 (L) 02/07/2022   HGB 11.7 (L) 02/07/2022   HCT 34.5 (L) 02/07/2022   PLT 207.0 02/07/2022   GLUCOSE 100 (H) 02/07/2022   CHOL 193 02/07/2022   TRIG 45.0 02/07/2022   HDL 86.20 02/07/2022   LDLCALC 98 02/07/2022   ALT 15 02/07/2022   AST 18 02/07/2022   NA 140 02/07/2022   K 4.0 02/07/2022   CL 107 02/07/2022   CREATININE 0.82 02/07/2022   BUN 14 02/07/2022   CO2 26 02/07/2022   TSH 1.69 02/07/2022    DG Shoulder Right  Result Date: 11/21/2017 CLINICAL DATA:  Right shoulder pain after injury 3 days ago. EXAM: RIGHT SHOULDER - 2+ VIEW COMPARISON:  None. FINDINGS: There is no evidence of fracture or dislocation. There is no evidence of arthropathy or other focal bone abnormality. Soft tissues are  unremarkable. IMPRESSION: Normal right shoulder. Electronically Signed   By: Lupita Raider, M.D.   On: 11/21/2017 13:46    Assessment & Plan:   Problem List Items Addressed This Visit     Vitamin D deficiency    Take Vit D daily      Well adult exam - Primary     We discussed age appropriate health related issues, including available/recomended screening tests and vaccinations. Labs were ordered to be later reviewed . All questions were answered. We discussed one or more of the following - seat belt use, use of sunscreen/sun exposure exercise, fall risk reduction, second hand smoke exposure, firearm use and storage, seat belt use, a need for adhering to healthy diet and exercise. Labs were ordered.  All questions were answered. Coronary calcium CT score test was offered - Coronary calcium CT score test was offered - pt declined Mammo, DEXA - declined Cologuard ordered      Relevant Orders   TSH   Urinalysis   CBC with Differential/Platelet   Lipid panel   Comprehensive metabolic panel   Other Visit Diagnoses     Screening for colon cancer       Relevant Orders   Cologuard         No orders of the defined types were placed in this encounter.     Follow-up: Return in about 1 year (around 03/01/2024) for a follow-up visit.  Sonda Primes, MD

## 2023-03-02 NOTE — Addendum Note (Signed)
Addended by: Delsa Grana R on: 03/02/2023 10:24 AM   Modules accepted: Orders

## 2023-03-02 NOTE — Assessment & Plan Note (Addendum)
  We discussed age appropriate health related issues, including available/recomended screening tests and vaccinations. Labs were ordered to be later reviewed . All questions were answered. We discussed one or more of the following - seat belt use, use of sunscreen/sun exposure exercise, fall risk reduction, second hand smoke exposure, firearm use and storage, seat belt use, a need for adhering to healthy diet and exercise. Labs were ordered.  All questions were answered. Coronary calcium CT score test was offered - Coronary calcium CT score test was offered - pt declined Mammo, DEXA - declined Cologuard ordered

## 2023-03-02 NOTE — Assessment & Plan Note (Signed)
Take Vit D daily

## 2023-07-03 ENCOUNTER — Ambulatory Visit (INDEPENDENT_AMBULATORY_CARE_PROVIDER_SITE_OTHER): Payer: Medicare Other

## 2023-07-03 ENCOUNTER — Telehealth: Payer: Self-pay

## 2023-07-03 DIAGNOSIS — Z23 Encounter for immunization: Secondary | ICD-10-CM

## 2023-07-03 NOTE — Telephone Encounter (Signed)
Called PT to confirm with department at [facility] would be best to send her proof of flu vaccine immunization to and LVM.

## 2023-07-03 NOTE — Progress Notes (Addendum)
PT visits today for their flu vaccine. PT was informed of what they were receiving and tolerated injection well. PT notified to reach out to office if needed.  Medical screening examination/treatment/procedure(s) were performed by non-physician practitioner and as supervising physician I was immediately available for consultation/collaboration.  I agree with above. Jacinta Shoe, MD

## 2023-07-12 NOTE — Telephone Encounter (Signed)
Patient states that Flu Vaccine proof needs to be sent to [facility], please fax by 07/18/23 per patient request.

## 2023-07-19 NOTE — Telephone Encounter (Signed)
Spoke with representative at [facility] [employer] health and they stated PT will have to have a printed copy of the proof of vaccination and upload it herself via their online portal. Will reach out to PT to inform her of such.

## 2023-12-08 ENCOUNTER — Ambulatory Visit

## 2023-12-08 VITALS — BP 120/70 | HR 51 | Ht 63.0 in | Wt 154.4 lb

## 2023-12-08 DIAGNOSIS — Z0000 Encounter for general adult medical examination without abnormal findings: Secondary | ICD-10-CM | POA: Diagnosis not present

## 2023-12-08 NOTE — Progress Notes (Cosign Needed Addendum)
 Subjective:   Sarah Sparks is a 78 y.o. who presents for a Medicare Wellness preventive visit.  Visit Complete: In person  Persons Participating in Visit: Patient.  AWV Questionnaire: No: Patient Medicare AWV questionnaire was not completed prior to this visit.  Cardiac Risk Factors include: advanced age (>39men, >42 women)     Objective:    Today's Vitals   12/08/23 0859  BP: 120/70  Pulse: (!) 51  SpO2: 98%  Weight: 154 lb 6.4 oz (70 kg)  Height: 5\' 3"  (1.6 m)   Body mass index is 27.35 kg/m.     12/08/2023    9:03 AM  Advanced Directives  Does Patient Have a Medical Advance Directive? Yes  Type of Estate agent of [place];Living will  Copy of Healthcare Power of Attorney in Chart? No - copy requested    Current Medications (verified) Outpatient Encounter Medications as of 12/08/2023  Medication Sig   Multiple Vitamin (MULTIVITAMIN ADULT PO) Take by mouth daily.   Cholecalciferol  (VITAMIN D3) 50 MCG (2000 UT) capsule Take 1 capsule (2,000 Units total) by mouth daily. (Patient not taking: Reported on 12/08/2023)   No facility-administered encounter medications on file as of 12/08/2023.    Allergies (verified) Patient has no known allergies.   History: Past Medical History:  Diagnosis Date   Microscopic hematuria    DR. PETERSON   Past Surgical History:  Procedure Laterality Date   BUNIONECTOMY     Family History  Problem Relation Age of Onset   Hypertension Mother    Stroke Mother    Heart disease Paternal Grandfather    Social History   Socioeconomic History   Marital status: Widowed    Spouse name: Not on file   Number of children: 1   Years of education: Not on file   Highest education level: Not on file  Occupational History   Occupation: RETIRED  Tobacco Use   Smoking status: Never   Smokeless tobacco: Never  Vaping Use   Vaping status: Never Used  Substance and Sexual Activity   Alcohol use: Yes    Comment:  rare   Drug use: No   Sexual activity: Yes    Birth control/protection: Post-menopausal  Other Topics Concern   Not on file  Social History Narrative   ** Merged History Encounter **    Lives alone   Social Drivers of Health   Financial Resource Strain: Low Risk  (12/08/2023)   Overall Financial Resource Strain (CARDIA)    Difficulty of Paying Living Expenses: Not hard at all  Food Insecurity: No Food Insecurity (12/08/2023)   Hunger Vital Sign    Worried About Running Out of Food in the Last Year: Never true    Ran Out of Food in the Last Year: Never true  Transportation Needs: No Transportation Needs (12/08/2023)   PRAPARE - Administrator, Civil Service (Medical): No    Lack of Transportation (Non-Medical): No  Physical Activity: Sufficiently Active (12/08/2023)   Exercise Vital Sign    Days of Exercise per Week: 5 days    Minutes of Exercise per Session: 40 min  Stress: No Stress Concern Present (12/08/2023)   [employer] of Occupational Health - Occupational Stress Questionnaire    Feeling of Stress : Not at all  Social Connections: Socially Isolated (12/08/2023)   Social Connection and Isolation Panel [NHANES]    Frequency of Communication with Friends and Family: More than three times a week    Frequency  of Social Gatherings with Friends and Family: Never    Attends Religious Services: Never    Database administrator or Organizations: No    Attends Banker Meetings: Never    Marital Status: Widowed    Tobacco Counseling Counseling given: Not Answered    Clinical Intake:  Pre-visit preparation completed: Yes  Pain : No/denies pain     BMI - recorded: 27.35 Nutritional Status: BMI 25 -29 Overweight Nutritional Risks: None Diabetes: No  No results found for: "HGBA1C"   How often do you need to have someone help you when you read instructions, pamphlets, or other written materials from your doctor or pharmacy?: 1 -  Never  Interpreter Needed?: No  Information entered by :: Jontez Redfield, RMA   Activities of Daily Living     12/08/2023    8:54 AM  In your present state of health, do you have any difficulty performing the following activities:  Hearing? 0  Vision? 0  Difficulty concentrating or making decisions? 0  Walking or climbing stairs? 0  Dressing or bathing? 0  Doing errands, shopping? 0  Preparing Food and eating ? N  Using the Toilet? N  In the past six months, have you accidently leaked urine? N  Do you have problems with loss of bowel control? N  Managing your Medications? N  Managing your Finances? N  Housekeeping or managing your Housekeeping? N    Patient Care Team: Plotnikov, Oakley Bellman, MD as PCP - General (Internal Medicine) Fontaine, Fatima Honour, MD (Inactive) as Consulting Physician (Gynecology)  Indicate any recent Medical Services you may have received from other than [facility] providers in the past year (date may be approximate).     Assessment:   This is a routine wellness examination for Sarah Sparks.  Hearing/Vision screen Hearing Screening - Comments:: Denies hearing difficulties   Vision Screening - Comments:: Wears eyeglasses   Goals Addressed               This Visit's Progress     Patient Stated (pt-stated)        Not at this time.         Depression Screen     12/08/2023    9:05 AM 03/02/2023    8:16 AM 02/07/2022    9:34 AM 02/04/2021   10:03 AM 01/31/2019   10:18 AM 01/25/2018   10:40 AM 01/24/2017    1:47 PM  PHQ 2/9 Scores  PHQ - 2 Score 0 0 0 0 0 0 0  PHQ- 9 Score 0   0       Fall Risk     12/08/2023    9:04 AM 03/02/2023    8:16 AM 02/07/2022    9:34 AM 02/04/2021    9:43 AM 01/31/2019   10:18 AM  Fall Risk   Falls in the past year? 0 0 0 0 0  Number falls in past yr: 0 0 0 0   Injury with Fall? 0 0 0 0   Risk for fall due to : No Fall Risks No Fall Risks No Fall Risks No Fall Risks   Follow up Falls prevention discussed;Falls evaluation  completed Falls evaluation completed Falls evaluation completed  Falls evaluation completed    MEDICARE RISK AT HOME:  Medicare Risk at Home Any stairs in or around the home?: No If so, are there any without handrails?: No Home free of loose throw rugs in walkways, pet beds, electrical cords, etc?: Yes Adequate lighting  in your home to reduce risk of falls?: Yes Life alert?: No Use of a cane, walker or w/c?: No Grab bars in the bathroom?: Yes Shower chair or bench in shower?: Yes Elevated toilet seat or a handicapped toilet?: Yes  TIMED UP AND GO:  Was the test performed?  No  Cognitive Function: Declined/Normal: No cognitive concerns noted by patient or family. Patient alert, oriented, able to answer questions appropriately and recall recent events. No signs of memory loss or confusion.        Immunizations Immunization History  Administered Date(s) Administered   Influenza, Seasonal, Injecte, Preservative Fre 07/03/2023   Influenza,inj,Quad PF,6+ Mos 08/25/2021, 06/17/2022   Pneumococcal Conjugate-13 12/23/2013   Pneumococcal Polysaccharide-23 12/21/2012, 02/03/2020   Td 12/21/2012   Tdap 03/02/2023   Zoster, Live 01/25/2012    Screening Tests Health Maintenance  Topic Date Due   Medicare Annual Wellness (AWV)  Never done   COVID-19 Vaccine (1 - 2024-25 season) Never done   INFLUENZA VACCINE  03/15/2024   DTaP/Tdap/Td (3 - Td or Tdap) 03/01/2033   Pneumonia Vaccine 36+ Years old  Completed   DEXA SCAN  Completed   Hepatitis C Screening  Completed   HPV VACCINES  Aged Out   Meningococcal B Vaccine  Aged Out   Fecal DNA (Cologuard)  Discontinued   Zoster Vaccines- Shingrix  Discontinued    Health Maintenance  Health Maintenance Due  Topic Date Due   Medicare Annual Wellness (AWV)  Never done   COVID-19 Vaccine (1 - 2024-25 season) Never done   Health Maintenance Items Addressed: See Nurse Notes  Additional Screening:  Vision Screening: Recommended  annual ophthalmology exams for early detection of glaucoma and other disorders of the eye.  Dental Screening: Recommended annual dental exams for proper oral hygiene  Community Resource Referral / Chronic Care Management: CRR required this visit?  No   CCM required this visit?  No     Plan:     I have personally reviewed and noted the following in the patient's chart:   Medical and social history Use of alcohol, tobacco or illicit drugs  Current medications and supplements including opioid prescriptions. Patient is not currently taking opioid prescriptions. Functional ability and status Nutritional status Physical activity Advanced directives List of other physicians Hospitalizations, surgeries, and ER visits in previous 12 months Vitals Screenings to include cognitive, depression, and falls Referrals and appointments  In addition, I have reviewed and discussed with patient certain preventive protocols, quality metrics, and best practice recommendations. A written personalized care plan for preventive services as well as general preventive health recommendations were provided to patient.     Jayvan Mcshan L Eastin Swing, CMA   12/08/2023   After Visit Summary: (MyChart) Due to this being a telephonic visit, the after visit summary with patients personalized plan was offered to patient via MyChart   Notes: Please refer to Routing Comments.  Medical screening examination/treatment/procedure(s) were performed by non-physician practitioner and as supervising physician I was immediately available for consultation/collaboration.  I agree with above. Adelaide Holy, MD

## 2023-12-08 NOTE — Patient Instructions (Signed)
 Ms. Sarah Sparks , Thank you for taking time to come for your Medicare Wellness Visit. I appreciate your ongoing commitment to your health goals. Please review the following plan we discussed and let me know if I can assist you in the future.   Referrals/Orders/Follow-Ups/Clinician Recommendations: It was nice meeting you today.  Keep up the good work.  This is a list of the screening recommended for you and due dates:  Health Maintenance  Topic Date Due   Medicare Annual Wellness Visit  Never done   COVID-19 Vaccine (1 - 2024-25 season) Never done   Flu Shot  03/15/2024   DTaP/Tdap/Td vaccine (3 - Td or Tdap) 03/01/2033   Pneumonia Vaccine  Completed   DEXA scan (bone density measurement)  Completed   Hepatitis C Screening  Completed   HPV Vaccine  Aged Out   Meningitis B Vaccine  Aged Out   Cologuard (Stool DNA test)  Discontinued   Zoster (Shingles) Vaccine  Discontinued    Advanced directives: (Copy Requested) Please bring a copy of your health care power of attorney and living will to the office to be added to your chart at your convenience. You can mail to [facility] [address] [place], [place] [address] or email to [email or web address]  Next Medicare Annual Wellness Visit scheduled for next year: Yes

## 2024-02-23 DIAGNOSIS — H2513 Age-related nuclear cataract, bilateral: Secondary | ICD-10-CM | POA: Diagnosis not present

## 2024-03-04 ENCOUNTER — Ambulatory Visit (INDEPENDENT_AMBULATORY_CARE_PROVIDER_SITE_OTHER): Payer: Medicare Other | Admitting: Internal Medicine

## 2024-03-04 ENCOUNTER — Encounter: Payer: Self-pay | Admitting: Internal Medicine

## 2024-03-04 VITALS — BP 130/78 | HR 50 | Temp 97.8°F | Ht 63.0 in | Wt 153.0 lb

## 2024-03-04 DIAGNOSIS — F4321 Adjustment disorder with depressed mood: Secondary | ICD-10-CM

## 2024-03-04 DIAGNOSIS — E559 Vitamin D deficiency, unspecified: Secondary | ICD-10-CM | POA: Diagnosis not present

## 2024-03-04 DIAGNOSIS — R7989 Other specified abnormal findings of blood chemistry: Secondary | ICD-10-CM

## 2024-03-04 DIAGNOSIS — Z1211 Encounter for screening for malignant neoplasm of colon: Secondary | ICD-10-CM

## 2024-03-04 DIAGNOSIS — R946 Abnormal results of thyroid function studies: Secondary | ICD-10-CM | POA: Diagnosis not present

## 2024-03-04 DIAGNOSIS — Z0000 Encounter for general adult medical examination without abnormal findings: Secondary | ICD-10-CM | POA: Diagnosis not present

## 2024-03-04 LAB — URINALYSIS
Bilirubin Urine: NEGATIVE
Ketones, ur: NEGATIVE
Nitrite: NEGATIVE
Specific Gravity, Urine: 1.025 (ref 1.000–1.030)
Total Protein, Urine: NEGATIVE
Urine Glucose: NEGATIVE
Urobilinogen, UA: 0.2 (ref 0.0–1.0)
pH: 6 (ref 5.0–8.0)

## 2024-03-04 LAB — COMPREHENSIVE METABOLIC PANEL WITH GFR
ALT: 13 U/L (ref 0–35)
AST: 17 U/L (ref 0–37)
Albumin: 4.3 g/dL (ref 3.5–5.2)
Alkaline Phosphatase: 70 U/L (ref 39–117)
BUN: 18 mg/dL (ref 6–23)
CO2: 24 meq/L (ref 19–32)
Calcium: 9.5 mg/dL (ref 8.4–10.5)
Chloride: 108 meq/L (ref 96–112)
Creatinine, Ser: 0.71 mg/dL (ref 0.40–1.20)
GFR: 82.55 mL/min (ref >60.00)
Glucose, Bld: 102 mg/dL — ABNORMAL HIGH (ref 70–99)
Potassium: 3.9 meq/L (ref 3.5–5.1)
Sodium: 139 meq/L (ref 135–145)
Total Bilirubin: 0.9 mg/dL (ref 0.2–1.2)
Total Protein: 7.1 g/dL (ref 6.0–8.3)

## 2024-03-04 LAB — URINALYSIS, ROUTINE W REFLEX MICROSCOPIC
Bilirubin Urine: NEGATIVE
Ketones, ur: NEGATIVE
Nitrite: NEGATIVE
Specific Gravity, Urine: 1.025 (ref 1.000–1.030)
Total Protein, Urine: NEGATIVE
Urine Glucose: NEGATIVE
Urobilinogen, UA: 0.2 (ref 0.0–1.0)
pH: 6 (ref 5.0–8.0)

## 2024-03-04 LAB — CBC WITH DIFFERENTIAL/PLATELET
Basophils Absolute: 0 K/uL (ref 0.0–0.1)
Basophils Relative: 0.7 % (ref 0.0–3.0)
Eosinophils Absolute: 0.2 K/uL (ref 0.0–0.7)
Eosinophils Relative: 3.5 % (ref 0.0–5.0)
HCT: 36.4 % (ref 36.0–46.0)
Hemoglobin: 12.3 g/dL (ref 12.0–15.0)
Lymphocytes Relative: 43 % (ref 12.0–46.0)
Lymphs Abs: 1.9 K/uL (ref 0.7–4.0)
MCHC: 33.8 g/dL (ref 30.0–36.0)
MCV: 92.6 fl (ref 78.0–100.0)
Monocytes Absolute: 0.3 K/uL (ref 0.1–1.0)
Monocytes Relative: 8 % (ref 3.0–12.0)
Neutro Abs: 1.9 K/uL (ref 1.4–7.7)
Neutrophils Relative %: 44.8 % (ref 43.0–77.0)
Platelets: 223 K/uL (ref 150.0–400.0)
RBC: 3.93 Mil/uL (ref 3.87–5.11)
RDW: 12.7 % (ref 11.5–15.5)
WBC: 4.3 K/uL (ref 4.0–10.5)

## 2024-03-04 LAB — LIPID PANEL
Cholesterol: 191 mg/dL (ref 0–200)
HDL: 83.4 mg/dL (ref >39.00)
LDL Cholesterol: 97 mg/dL (ref 0–99)
NonHDL: 107.39
Total CHOL/HDL Ratio: 2
Triglycerides: 52 mg/dL (ref 0.0–149.0)
VLDL: 10.4 mg/dL (ref 0.0–40.0)

## 2024-03-04 LAB — MAGNESIUM: Magnesium: 2 mg/dL (ref 1.5–2.5)

## 2024-03-04 LAB — TSH: TSH: 1.84 u[IU]/mL (ref 0.35–5.50)

## 2024-03-04 LAB — VITAMIN D 25 HYDROXY (VIT D DEFICIENCY, FRACTURES): VITD: 24 ng/mL — ABNORMAL LOW (ref 30.00–100.00)

## 2024-03-04 NOTE — Assessment & Plan Note (Signed)
 Labs

## 2024-03-04 NOTE — Progress Notes (Signed)
 Subjective:  Patient ID: Sarah Sparks, female    DOB: [birth year]  Age: 78 y.o. MRN: [id number]  CC: Annual Exam   HPI Sarah Sparks presents for a well exam  Outpatient Medications Prior to Visit  Medication Sig Dispense Refill   Cholecalciferol  (VITAMIN D3) 50 MCG (2000 UT) capsule Take 1 capsule (2,000 Units total) by mouth daily. 100 capsule 3   Multiple Vitamin (MULTIVITAMIN ADULT PO) Take by mouth daily.     No facility-administered medications prior to visit.    ROS: Review of Systems  Constitutional:  Negative for activity change, appetite change, chills, fatigue and unexpected weight change.  HENT:  Negative for congestion, mouth sores and sinus pressure.   Eyes:  Negative for visual disturbance.  Respiratory:  Negative for cough and chest tightness.   Gastrointestinal:  Negative for abdominal pain and nausea.  Genitourinary:  Negative for difficulty urinating, frequency and vaginal pain.  Musculoskeletal:  Negative for back pain and gait problem.  Skin:  Negative for pallor and rash.  Neurological:  Negative for dizziness, tremors, weakness, numbness and headaches.  Psychiatric/Behavioral:  Negative for confusion and sleep disturbance.     Objective:  BP 130/78   Pulse (!) 50   Temp 97.8 F (36.6 C) (Oral)   Ht 5' 3 (1.6 m)   Wt 153 lb (69.4 kg)   SpO2 99%   BMI 27.10 kg/m   BP Readings from Last 3 Encounters:  03/04/24 130/78  12/08/23 120/70  03/02/23 118/70    Wt Readings from Last 3 Encounters:  03/04/24 153 lb (69.4 kg)  12/08/23 154 lb 6.4 oz (70 kg)  03/02/23 154 lb (69.9 kg)    Physical Exam Constitutional:      General: She is not in acute distress.    Appearance: She is well-developed.  HENT:     Head: Normocephalic.     Right Ear: External ear normal.     Left Ear: External ear normal.     Nose: Nose normal.  Eyes:     General:        Right eye: No discharge.        Left eye: No discharge.     Conjunctiva/sclera: Conjunctivae  normal.     Pupils: Pupils are equal, round, and reactive to light.  Neck:     Thyroid : No thyromegaly.     Vascular: No JVD.     Trachea: No tracheal deviation.  Cardiovascular:     Rate and Rhythm: Normal rate and regular rhythm.     Heart sounds: Normal heart sounds.  Pulmonary:     Effort: No respiratory distress.     Breath sounds: No stridor. No wheezing.  Abdominal:     General: Bowel sounds are normal. There is no distension.     Palpations: Abdomen is soft. There is no mass.     Tenderness: There is no abdominal tenderness. There is no guarding or rebound.  Musculoskeletal:        General: No tenderness.     Cervical back: Normal range of motion and neck supple. No rigidity.  Lymphadenopathy:     Cervical: No cervical adenopathy.  Skin:    Findings: No erythema or rash.  Neurological:     Mental Status: She is oriented to person, place, and time.     Cranial Nerves: No cranial nerve deficit.     Motor: No abnormal muscle tone.     Coordination: Coordination normal.     Gait: Gait abnormal.  Deep Tendon Reflexes: Reflexes normal.  Psychiatric:        Behavior: Behavior normal.        Thought Content: Thought content normal.        Judgment: Judgment normal.     Lab Results  Component Value Date   WBC 4.3 03/02/2023   HGB 11.9 (L) 03/02/2023   HCT 35.1 (L) 03/02/2023   PLT 198.0 03/02/2023   GLUCOSE 98 03/02/2023   CHOL 199 03/02/2023   TRIG 58.0 03/02/2023   HDL 83.60 03/02/2023   LDLCALC 104 (H) 03/02/2023   ALT 14 03/02/2023   AST 16 03/02/2023   NA 141 03/02/2023   K 4.0 03/02/2023   CL 108 03/02/2023   CREATININE 0.80 03/02/2023   BUN 15 03/02/2023   CO2 28 03/02/2023   TSH 1.84 03/02/2023    DG Shoulder Right Result Date: 11/21/2017 CLINICAL DATA:  Right shoulder pain after injury 3 days ago. EXAM: RIGHT SHOULDER - 2+ VIEW COMPARISON:  None. FINDINGS: There is no evidence of fracture or dislocation. There is no evidence of arthropathy or  other focal bone abnormality. Soft tissues are unremarkable. IMPRESSION: Normal right shoulder. Electronically Signed   By: Lynwood Landy Raddle, M.D.   On: 11/21/2017 13:46    Assessment & Plan:   Problem List Items Addressed This Visit     Vitamin D  deficiency   Check Vit D      Relevant Orders   VITAMIN D  25 Hydroxy (Vit-D Deficiency, Fractures)   Well adult exam - Primary   We discussed age appropriate health related issues, including available/recomended screening tests and vaccinations. Labs were ordered to be later reviewed . All questions were answered. We discussed one or more of the following - seat belt use, use of sunscreen/sun exposure exercise, fall risk reduction, second hand smoke exposure, firearm use and storage, seat belt use, a need for adhering to healthy diet and exercise. Labs were ordered.  All questions were answered. Coronary calcium CT score test was offered - Coronary calcium CT score test was offered - pt declined Mammo - declined, DEXA - declined Cologuard ordered 2024, 2025 Ophth appt q 12 months      Relevant Orders   TSH   Urinalysis   CBC with Differential/Platelet   Lipid panel   Comprehensive metabolic panel with GFR   VITAMIN D  25 Hydroxy (Vit-D Deficiency, Fractures)   Cologuard   Magnesium   Abnormal TSH   Labs      Relevant Orders   TSH   Lipid panel   Grief   Sarah Sparks's husband died in 07/12/21. Coping ok.      Other Visit Diagnoses       Screening for colon cancer       Relevant Orders   Cologuard         No orders of the defined types were placed in this encounter.     Follow-up: Return in about 1 year (around 03/04/2025) for Wellness Exam.  Marolyn Noel, MD

## 2024-03-04 NOTE — Assessment & Plan Note (Signed)
Sarah Sparks's husband died in Oct 2022. Coping ok.

## 2024-03-04 NOTE — Assessment & Plan Note (Signed)
 We discussed age appropriate health related issues, including available/recomended screening tests and vaccinations. Labs were ordered to be later reviewed . All questions were answered. We discussed one or more of the following - seat belt use, use of sunscreen/sun exposure exercise, fall risk reduction, second hand smoke exposure, firearm use and storage, seat belt use, a need for adhering to healthy diet and exercise. Labs were ordered.  All questions were answered. Coronary calcium CT score test was offered - Coronary calcium CT score test was offered - pt declined Mammo - declined, DEXA - declined Cologuard ordered 2024, 2025 Ophth appt q 12 months

## 2024-03-04 NOTE — Assessment & Plan Note (Signed)
 Check Vit D

## 2024-03-05 ENCOUNTER — Ambulatory Visit: Payer: Self-pay | Admitting: Internal Medicine

## 2024-03-05 ENCOUNTER — Telehealth: Payer: Self-pay

## 2024-03-05 NOTE — Telephone Encounter (Signed)
 Copied from CRM [id number]. Topic: General - Other >> Mar 05, 2024 12:44 PM Chasity T wrote: Reason for CRM: Patient is calling to speak with Dr Garald nurse regarding her brothers appointment. States that he says it was okay. Call patient back if needed for varication.

## 2024-03-08 NOTE — Telephone Encounter (Signed)
 Spoke with the pt and she has stated she will have her brother call the office so we can get his proper on file to make him a new pt apptmnt.

## 2024-03-12 DIAGNOSIS — Z1211 Encounter for screening for malignant neoplasm of colon: Secondary | ICD-10-CM | POA: Diagnosis not present

## 2024-03-18 LAB — COLOGUARD: COLOGUARD: NEGATIVE

## 2025-03-06 ENCOUNTER — Encounter: Admitting: Internal Medicine
